# Patient Record
Sex: Male | Born: 1935 | Race: White | Hispanic: No | Marital: Married | Smoking: Former smoker
Health system: Southern US, Community
[De-identification: ages and names within clinical notes are randomized; demographics above are authoritative.]

## PROBLEM LIST (undated history)

## (undated) DIAGNOSIS — J45909 Unspecified asthma, uncomplicated: Secondary | ICD-10-CM

## (undated) DIAGNOSIS — I219 Acute myocardial infarction, unspecified: Secondary | ICD-10-CM

## (undated) DIAGNOSIS — J449 Chronic obstructive pulmonary disease, unspecified: Secondary | ICD-10-CM

## (undated) DIAGNOSIS — G300 Alzheimer's disease with early onset: Secondary | ICD-10-CM

## (undated) DIAGNOSIS — I639 Cerebral infarction, unspecified: Secondary | ICD-10-CM

## (undated) DIAGNOSIS — G4733 Obstructive sleep apnea (adult) (pediatric): Secondary | ICD-10-CM

## (undated) DIAGNOSIS — R0602 Shortness of breath: Secondary | ICD-10-CM

## (undated) DIAGNOSIS — I2510 Atherosclerotic heart disease of native coronary artery without angina pectoris: Secondary | ICD-10-CM

## (undated) DIAGNOSIS — F0280 Dementia in other diseases classified elsewhere without behavioral disturbance: Secondary | ICD-10-CM

## (undated) DIAGNOSIS — N183 Chronic kidney disease, stage 3 unspecified: Secondary | ICD-10-CM

## (undated) DIAGNOSIS — G20 Parkinson's disease: Secondary | ICD-10-CM

## (undated) DIAGNOSIS — I10 Essential (primary) hypertension: Secondary | ICD-10-CM

## (undated) DIAGNOSIS — G20A1 Parkinson's disease without dyskinesia, without mention of fluctuations: Secondary | ICD-10-CM

## (undated) HISTORY — DX: Obstructive sleep apnea (adult) (pediatric): G47.33

## (undated) HISTORY — PX: CORONARY ARTERY BYPASS GRAFT: SHX141

## (undated) HISTORY — PX: CARDIAC CATHETERIZATION: SHX172

## (undated) HISTORY — PX: NASAL SINUS SURGERY: SHX719

## (undated) HISTORY — PX: EYE SURGERY: SHX253

---

## 2004-01-20 ENCOUNTER — Other Ambulatory Visit: Payer: Self-pay

## 2004-01-21 ENCOUNTER — Other Ambulatory Visit: Payer: Self-pay

## 2004-01-22 ENCOUNTER — Other Ambulatory Visit: Payer: Self-pay

## 2004-01-24 ENCOUNTER — Other Ambulatory Visit: Payer: Self-pay

## 2004-01-25 ENCOUNTER — Other Ambulatory Visit: Payer: Self-pay

## 2004-10-02 ENCOUNTER — Ambulatory Visit: Payer: Self-pay | Admitting: Internal Medicine

## 2004-11-04 ENCOUNTER — Ambulatory Visit: Payer: Self-pay

## 2004-11-21 ENCOUNTER — Ambulatory Visit: Payer: Self-pay

## 2005-07-22 ENCOUNTER — Ambulatory Visit: Payer: Self-pay | Admitting: Internal Medicine

## 2005-08-04 ENCOUNTER — Ambulatory Visit: Payer: Self-pay | Admitting: Otolaryngology

## 2005-11-18 ENCOUNTER — Other Ambulatory Visit: Payer: Self-pay

## 2005-11-18 ENCOUNTER — Ambulatory Visit: Payer: Self-pay | Admitting: Otolaryngology

## 2005-12-04 ENCOUNTER — Ambulatory Visit: Payer: Self-pay | Admitting: Otolaryngology

## 2007-06-17 ENCOUNTER — Ambulatory Visit: Payer: Self-pay | Admitting: Internal Medicine

## 2007-07-02 ENCOUNTER — Ambulatory Visit: Payer: Self-pay | Admitting: Surgery

## 2008-06-20 ENCOUNTER — Ambulatory Visit: Payer: Self-pay | Admitting: Internal Medicine

## 2008-07-20 ENCOUNTER — Ambulatory Visit: Payer: Self-pay | Admitting: Internal Medicine

## 2008-08-10 ENCOUNTER — Ambulatory Visit: Payer: Self-pay | Admitting: Internal Medicine

## 2008-08-31 ENCOUNTER — Ambulatory Visit: Payer: Self-pay | Admitting: Internal Medicine

## 2008-10-06 ENCOUNTER — Emergency Department (HOSPITAL_COMMUNITY): Admission: EM | Admit: 2008-10-06 | Discharge: 2008-10-06 | Payer: Self-pay | Admitting: Emergency Medicine

## 2010-02-20 ENCOUNTER — Ambulatory Visit: Payer: Self-pay | Admitting: Neurology

## 2010-02-22 ENCOUNTER — Ambulatory Visit: Payer: Self-pay | Admitting: Neurology

## 2010-08-01 ENCOUNTER — Ambulatory Visit: Payer: Self-pay | Admitting: Internal Medicine

## 2010-08-01 ENCOUNTER — Ambulatory Visit: Payer: Self-pay | Admitting: Vascular Surgery

## 2010-08-22 ENCOUNTER — Ambulatory Visit: Payer: Self-pay | Admitting: Vascular Surgery

## 2010-09-03 ENCOUNTER — Inpatient Hospital Stay: Payer: Self-pay | Admitting: Vascular Surgery

## 2011-03-24 ENCOUNTER — Ambulatory Visit: Payer: Self-pay | Admitting: Internal Medicine

## 2011-03-24 ENCOUNTER — Other Ambulatory Visit: Payer: Self-pay | Admitting: Family Medicine

## 2011-05-16 ENCOUNTER — Encounter: Payer: Self-pay | Admitting: Podiatry

## 2011-05-16 DIAGNOSIS — F329 Major depressive disorder, single episode, unspecified: Secondary | ICD-10-CM

## 2011-05-16 DIAGNOSIS — R413 Other amnesia: Secondary | ICD-10-CM

## 2011-05-16 DIAGNOSIS — F32A Depression, unspecified: Secondary | ICD-10-CM | POA: Insufficient documentation

## 2011-05-16 DIAGNOSIS — R0989 Other specified symptoms and signs involving the circulatory and respiratory systems: Secondary | ICD-10-CM

## 2011-09-23 LAB — COMPREHENSIVE METABOLIC PANEL
ALT: 21
Alkaline Phosphatase: 66
BUN: 19
Calcium: 9.1
Chloride: 103
GFR calc Af Amer: 57 — ABNORMAL LOW
GFR calc non Af Amer: 47 — ABNORMAL LOW
Sodium: 135
Total Bilirubin: 1
Total Protein: 6.5

## 2011-09-23 LAB — URINALYSIS, ROUTINE W REFLEX MICROSCOPIC
Bilirubin Urine: NEGATIVE
Hgb urine dipstick: NEGATIVE
Ketones, ur: NEGATIVE
Specific Gravity, Urine: 1.023
Urobilinogen, UA: 0.2

## 2011-09-23 LAB — CBC
HCT: 38.3 — ABNORMAL LOW
MCHC: 34
MCV: 94.3
Platelets: 132 — ABNORMAL LOW
RBC: 4.06 — ABNORMAL LOW

## 2011-09-23 LAB — DIFFERENTIAL
Basophils Absolute: 0
Eosinophils Absolute: 0
Monocytes Relative: 3
Neutrophils Relative %: 92 — ABNORMAL HIGH

## 2011-09-23 LAB — URINE MICROSCOPIC-ADD ON

## 2012-02-24 ENCOUNTER — Ambulatory Visit: Payer: Self-pay | Admitting: Vascular Surgery

## 2012-02-24 LAB — BASIC METABOLIC PANEL
Anion Gap: 8 (ref 7–16)
BUN: 22 mg/dL — ABNORMAL HIGH (ref 7–18)
Chloride: 102 mmol/L (ref 98–107)
Co2: 28 mmol/L (ref 21–32)
Osmolality: 279 (ref 275–301)
Potassium: 4 mmol/L (ref 3.5–5.1)
Sodium: 138 mmol/L (ref 136–145)

## 2012-06-03 ENCOUNTER — Inpatient Hospital Stay (HOSPITAL_COMMUNITY)
Admission: EM | Admit: 2012-06-03 | Discharge: 2012-06-04 | DRG: 070 | Disposition: A | Discharge status: Home / Self Care | Payer: Medicare Other | Source: Ambulatory Visit | Attending: Family Medicine | Admitting: Family Medicine

## 2012-06-03 ENCOUNTER — Encounter (HOSPITAL_COMMUNITY): Payer: Self-pay | Admitting: *Deleted

## 2012-06-03 ENCOUNTER — Emergency Department (HOSPITAL_COMMUNITY): Payer: Medicare Other

## 2012-06-03 ENCOUNTER — Observation Stay (HOSPITAL_COMMUNITY): Payer: Medicare Other

## 2012-06-03 DIAGNOSIS — J40 Bronchitis, not specified as acute or chronic: Secondary | ICD-10-CM

## 2012-06-03 DIAGNOSIS — N183 Chronic kidney disease, stage 3 unspecified: Secondary | ICD-10-CM | POA: Diagnosis present

## 2012-06-03 DIAGNOSIS — J441 Chronic obstructive pulmonary disease with (acute) exacerbation: Secondary | ICD-10-CM | POA: Diagnosis present

## 2012-06-03 DIAGNOSIS — I2510 Atherosclerotic heart disease of native coronary artery without angina pectoris: Secondary | ICD-10-CM | POA: Diagnosis present

## 2012-06-03 DIAGNOSIS — Z8673 Personal history of transient ischemic attack (TIA), and cerebral infarction without residual deficits: Secondary | ICD-10-CM

## 2012-06-03 DIAGNOSIS — R410 Disorientation, unspecified: Secondary | ICD-10-CM

## 2012-06-03 DIAGNOSIS — Z79899 Other long term (current) drug therapy: Secondary | ICD-10-CM

## 2012-06-03 DIAGNOSIS — R509 Fever, unspecified: Secondary | ICD-10-CM

## 2012-06-03 DIAGNOSIS — J189 Pneumonia, unspecified organism: Secondary | ICD-10-CM | POA: Diagnosis present

## 2012-06-03 DIAGNOSIS — Z7982 Long term (current) use of aspirin: Secondary | ICD-10-CM

## 2012-06-03 DIAGNOSIS — I129 Hypertensive chronic kidney disease with stage 1 through stage 4 chronic kidney disease, or unspecified chronic kidney disease: Secondary | ICD-10-CM | POA: Diagnosis present

## 2012-06-03 DIAGNOSIS — Z9861 Coronary angioplasty status: Secondary | ICD-10-CM

## 2012-06-03 DIAGNOSIS — G9341 Metabolic encephalopathy: Principal | ICD-10-CM | POA: Diagnosis present

## 2012-06-03 DIAGNOSIS — Z87891 Personal history of nicotine dependence: Secondary | ICD-10-CM

## 2012-06-03 DIAGNOSIS — F0390 Unspecified dementia without behavioral disturbance: Secondary | ICD-10-CM | POA: Diagnosis present

## 2012-06-03 HISTORY — DX: Alzheimer's disease with early onset: G30.0

## 2012-06-03 HISTORY — DX: Essential (primary) hypertension: I10

## 2012-06-03 HISTORY — DX: Cerebral infarction, unspecified: I63.9

## 2012-06-03 HISTORY — DX: Dementia in other diseases classified elsewhere, unspecified severity, without behavioral disturbance, psychotic disturbance, mood disturbance, and anxiety: F02.80

## 2012-06-03 HISTORY — DX: Unspecified asthma, uncomplicated: J45.909

## 2012-06-03 HISTORY — DX: Shortness of breath: R06.02

## 2012-06-03 HISTORY — DX: Acute myocardial infarction, unspecified: I21.9

## 2012-06-03 LAB — COMPREHENSIVE METABOLIC PANEL
AST: 25 U/L (ref 0–37)
Albumin: 3.5 g/dL (ref 3.5–5.2)
CO2: 20 mEq/L (ref 19–32)
Calcium: 9.6 mg/dL (ref 8.4–10.5)
Chloride: 102 mEq/L (ref 96–112)
Creatinine, Ser: 1.48 mg/dL — ABNORMAL HIGH (ref 0.50–1.35)
GFR calc Af Amer: 51 mL/min — ABNORMAL LOW (ref >90)
GFR calc non Af Amer: 44 mL/min — ABNORMAL LOW (ref >90)
Glucose, Bld: 175 mg/dL — ABNORMAL HIGH (ref 70–99)
Potassium: 4.7 mEq/L (ref 3.5–5.1)
Sodium: 138 mEq/L (ref 135–145)
Total Protein: 7.3 g/dL (ref 6.0–8.3)

## 2012-06-03 LAB — DIFFERENTIAL
Basophils Absolute: 0 10*3/uL (ref 0.0–0.1)
Basophils Relative: 0 % (ref 0–1)
Eosinophils Absolute: 0.1 10*3/uL (ref 0.0–0.7)
Monocytes Absolute: 1 10*3/uL (ref 0.1–1.0)
Neutro Abs: 10.4 10*3/uL — ABNORMAL HIGH (ref 1.7–7.7)

## 2012-06-03 LAB — URINALYSIS, ROUTINE W REFLEX MICROSCOPIC
Glucose, UA: NEGATIVE mg/dL
Hgb urine dipstick: NEGATIVE
Ketones, ur: NEGATIVE mg/dL
Protein, ur: NEGATIVE mg/dL
Urobilinogen, UA: 0.2 mg/dL (ref 0.0–1.0)

## 2012-06-03 LAB — LACTIC ACID, PLASMA: Lactic Acid, Venous: 1.8 mmol/L (ref 0.5–2.2)

## 2012-06-03 LAB — CBC
HCT: 38.3 % — ABNORMAL LOW (ref 39.0–52.0)
MCH: 32.3 pg (ref 26.0–34.0)
MCHC: 34.6 g/dL (ref 30.0–36.0)
MCV: 93 fL (ref 78.0–100.0)
MCV: 93.4 fL (ref 78.0–100.0)
Platelets: 127 10*3/uL — ABNORMAL LOW (ref 150–400)
RBC: 4.12 MIL/uL — ABNORMAL LOW (ref 4.22–5.81)
RBC: 3.65 MIL/uL — ABNORMAL LOW (ref 4.22–5.81)

## 2012-06-03 LAB — TROPONIN I: Troponin I: <0.3 ng/mL (ref <0.30)

## 2012-06-03 MED ORDER — MOXIFLOXACIN HCL IN NACL 400 MG/250ML IV SOLN
400.0000 mg | Freq: Once | INTRAVENOUS | Status: AC
  Administered 2012-06-03: 400 mg via INTRAVENOUS
  Filled 2012-06-03: qty 250

## 2012-06-03 MED ORDER — DEXTROSE-NACL 5-0.45 % IV SOLN
INTRAVENOUS | Status: AC
  Administered 2012-06-03: 1000 mL via INTRAVENOUS

## 2012-06-03 MED ORDER — SIMVASTATIN 40 MG PO TABS
40.0000 mg | ORAL_TABLET | Freq: Every day | ORAL | Status: DC
  Administered 2012-06-03: 40 mg via ORAL
  Filled 2012-06-03 (×2): qty 1

## 2012-06-03 MED ORDER — METOPROLOL SUCCINATE ER 50 MG PO TB24
75.0000 mg | ORAL_TABLET | Freq: Every day | ORAL | Status: DC
  Administered 2012-06-04: 75 mg via ORAL
  Filled 2012-06-03: qty 1

## 2012-06-03 MED ORDER — MELOXICAM 15 MG PO TABS: 15.0000 mg | ORAL_TABLET | Freq: Every day | ORAL | Status: DC

## 2012-06-03 MED ORDER — AMLODIPINE BESYLATE 5 MG PO TABS
5.0000 mg | ORAL_TABLET | Freq: Every day | ORAL | Status: DC
  Administered 2012-06-04: 5 mg via ORAL
  Filled 2012-06-03: qty 1

## 2012-06-03 MED ORDER — SODIUM CHLORIDE 0.9 % IJ SOLN: 3.0000 mL | Freq: Two times a day (BID) | INTRAMUSCULAR | Status: DC

## 2012-06-03 MED ORDER — ACETAMINOPHEN 325 MG PO TABS
650.0000 mg | ORAL_TABLET | Freq: Once | ORAL | Status: AC
  Administered 2012-06-03: 650 mg via ORAL
  Filled 2012-06-03: qty 2

## 2012-06-03 MED ORDER — SODIUM CHLORIDE 0.9 % IV SOLN
1000.0000 mL | INTRAVENOUS | Status: DC
  Administered 2012-06-03 – 2012-06-04 (×2): 1000 mL via INTRAVENOUS

## 2012-06-03 MED ORDER — SODIUM CHLORIDE 0.9 % IV SOLN
1000.0000 mL | Freq: Once | INTRAVENOUS | Status: AC
  Administered 2012-06-03: 1000 mL via INTRAVENOUS

## 2012-06-03 MED ORDER — CITALOPRAM HYDROBROMIDE 20 MG PO TABS
20.0000 mg | ORAL_TABLET | Freq: Every day | ORAL | Status: DC
  Administered 2012-06-04: 20 mg via ORAL
  Filled 2012-06-03: qty 1

## 2012-06-03 MED ORDER — MOXIFLOXACIN HCL IN NACL 400 MG/250ML IV SOLN
400.0000 mg | INTRAVENOUS | Status: DC
  Filled 2012-06-03: qty 250

## 2012-06-03 MED ORDER — POLYETHYLENE GLYCOL 3350 17 G PO PACK
17.0000 g | PACK | Freq: Every day | ORAL | Status: DC | PRN
  Filled 2012-06-03: qty 1

## 2012-06-03 MED ORDER — HEPARIN SODIUM (PORCINE) 5000 UNIT/ML IJ SOLN
5000.0000 [IU] | Freq: Three times a day (TID) | INTRAMUSCULAR | Status: DC
  Administered 2012-06-03: 5000 [IU] via SUBCUTANEOUS
  Filled 2012-06-03 (×5): qty 1

## 2012-06-03 MED ORDER — ACETAMINOPHEN 325 MG PO TABS: 650.0000 mg | ORAL_TABLET | Freq: Four times a day (QID) | ORAL | Status: DC | PRN

## 2012-06-03 MED ORDER — ENALAPRIL MALEATE 20 MG PO TABS
20.0000 mg | ORAL_TABLET | Freq: Two times a day (BID) | ORAL | Status: DC
  Filled 2012-06-03: qty 1

## 2012-06-03 MED ORDER — ASPIRIN EC 81 MG PO TBEC
81.0000 mg | DELAYED_RELEASE_TABLET | Freq: Every day | ORAL | Status: DC
  Administered 2012-06-04: 81 mg via ORAL
  Filled 2012-06-03: qty 1

## 2012-06-03 MED ORDER — FLUTICASONE-SALMETEROL 250-50 MCG/DOSE IN AEPB
1.0000 | INHALATION_SPRAY | Freq: Two times a day (BID) | RESPIRATORY_TRACT | Status: DC
  Filled 2012-06-03: qty 14

## 2012-06-03 MED ORDER — ENALAPRIL MALEATE 20 MG PO TABS
20.0000 mg | ORAL_TABLET | Freq: Two times a day (BID) | ORAL | Status: DC
  Administered 2012-06-03 – 2012-06-04 (×2): 20 mg via ORAL
  Filled 2012-06-03 (×3): qty 1

## 2012-06-03 MED ORDER — SODIUM CHLORIDE 0.9 % IV SOLN
INTRAVENOUS | Status: DC
  Administered 2012-06-03: 1000 mL via INTRAVENOUS

## 2012-06-03 MED ORDER — ACETAMINOPHEN 650 MG RE SUPP: 650.0000 mg | Freq: Four times a day (QID) | RECTAL | Status: DC | PRN

## 2012-06-03 NOTE — H&P (Signed)
Family Medicine Teaching [facility] Admission History and Physical  Patient name: [name] Medical record number: [id number] Date of birth: January 25, 1933 Age: 78 y.o. Gender: male  Primary Care Provider: Daniel Nones  Chief Complaint: AMS  History of Present Illness: [name] is a 78 y.o. year old male presenting with acute onset of AMS   Pt awoke this afternoon from his nap and came out to living room to sit in recliner. Pt started talking nonsensically and was disoriented and kept playing w/ his shorts. After further thought, wife did disclose that she felt like he was slurring his words, acting confused, and was off balance and possible weak.  Pt is demented at baseline, but per wife this was significantly worse. 911 was called and when EMS arrived pts BP was noted to be 220/110 per wife. Pt mental status improved significantly and was near baseline at time of exam. Pt has COPD and uses CPAP at night. Endorses some SOB over the past few months. Denies fever, CP, lightheadedness, syncope, palpitations, n/v/d/c. Has had a recent cough that has lasted for the past couple of months.  At the time of interview once on the floor, patient was aware of situation, person, and place, though couldn't remember what had happened earlier in the day (this is his baseline per his wife-- he would not be able to remember multiple hours ago on his best day).  He has no complaints of CP, SOB, confusion, weakness, headache.   Review Of Systems: Per HPI   Patient Active Problem List  Diagnosis  . Depression  . Memory loss  . Poor circulation   Past Medical History: Questionable Stroke (never hospitalized) --> per wife, 10 "mini strokes" found on imaging but they were not aware that these had occurred  DM Dementia HTN CAD (s/p stent placement) COPD  Past Surgical History: Endarterectomy on Rt Cardiac stent  Social History: History   Social History  . Marital Status: Married    Spouse  Name: N/A    Number of Children: N/A  . Years of Education: N/A   Social History Main Topics  . Smoking status: Former Smoker    Quit date: 12/18/2005  . Smokeless tobacco: Not on file  . Alcohol Use: No  . Drug Use: No  . Sexually Active:    Other Topics Concern  . Not on file   Social History Narrative  . No narrative on file    Family History: Family History  Problem Relation Age of Onset  . Cancer Father     Allergies: No Known Allergies  Current Facility-Administered Medications  Medication Dose Route Frequency Provider Last Rate Last Dose  . 0.9 %  sodium chloride infusion  1,000 mL Intravenous Once Celene Kras, MD 999 mL/hr at 06/03/12 1701 1,000 mL at 06/03/12 1701   Followed by  . 0.9 %  sodium chloride infusion  1,000 mL Intravenous Continuous Celene Kras, MD 125 mL/hr at 06/03/12 1806 1,000 mL at 06/03/12 1806  . acetaminophen (TYLENOL) tablet 650 mg  650 mg Oral Once Celene Kras, MD   650 mg at 06/03/12 1704  . moxifloxacin (AVELOX) IVPB 400 mg  400 mg Intravenous Once Celene Kras, MD   400 mg at 06/03/12 2003     Physical Exam: BP 123/52  Pulse 78  Temp 101.1 F (38.4 C) (Rectal)  Resp 21  SpO2 96%  General: NAD, comfortable. Oriented to person time and place HEENT: PERRLA, extra ocular  movement intact, sclera clear, anicteric, oropharynx clear, no lesions, neck supple with midline trachea, thyroid without masses, trachea midline and No nuchal rigidity Heart: Irregularly irreglar, no m/r/g. Distant heart sounds Lungs: Possible ronchi in L middle to lowe lung field, poor respiratory effort; scattered wheezes throughout all lung fields, possible rhonchi LUL cleared with cough Abdomen:NABS, non-painful to palpation Extremities: 2+ pulses throughout Musculoskeletal: normal ROM Skin:No rashes, non-icteric Neurology: CN grossly intact, AAOx3, moves limbs symmetrically, normal finger to nose and rapid alternating movements, no obvious weakness or strength  differences bilaterally   Labs and Imaging: CBC:    Component Value Date/Time   WBC 12.5* 06/03/2012 1606   HGB 13.4 06/03/2012 1606   HCT 38.3* 06/03/2012 1606   PLT 140* 06/03/2012 1606   MCV 93.0 06/03/2012 1606   NEUTROABS 10.4* 06/03/2012 1606   LYMPHSABS 1.0 06/03/2012 1606   MONOABS 1.0 06/03/2012 1606   EOSABS 0.1 06/03/2012 1606   BASOSABS 0.0 06/03/2012 1606     Comprehensive Metabolic Panel:    Component Value Date/Time   NA 138 06/03/2012 1606   K 4.7 06/03/2012 1606   CL 102 06/03/2012 1606   CO2 20 06/03/2012 1606   BUN 24* 06/03/2012 1606   CREATININE 1.48* 06/03/2012 1606   GLUCOSE 175* 06/03/2012 1606   CALCIUM 9.6 06/03/2012 1606   AST 25 06/03/2012 1606   ALT 15 06/03/2012 1606   ALKPHOS 65 06/03/2012 1606   BILITOT 0.7 06/03/2012 1606   PROT 7.3 06/03/2012 1606   ALBUMIN 3.5 06/03/2012 1606    Urinalysis    Component Value Date/Time   COLORURINE YELLOW 06/03/2012 1742   APPEARANCEUR CLEAR 06/03/2012 1742   LABSPEC 1.018 06/03/2012 1742   PHURINE 5.5 06/03/2012 1742   GLUCOSEU NEGATIVE 06/03/2012 1742   HGBUR NEGATIVE 06/03/2012 1742   BILIRUBINUR NEGATIVE 06/03/2012 1742   KETONESUR NEGATIVE 06/03/2012 1742   PROTEINUR NEGATIVE 06/03/2012 1742   UROBILINOGEN 0.2 06/03/2012 1742   NITRITE NEGATIVE 06/03/2012 1742   LEUKOCYTESUR NEGATIVE 06/03/2012 1742   Troponin: <0.3 Lactic Acid: 1.8 Procalcitonin <0.1  Urine and Blodd Cx x2 pending   Ct Head Wo Contrast IMPRESSION: No acute finding.  Atrophy and chronic microvascular ischemic change.  Original Report Authenticated By: Bernadene Bell. D'ALESSIO, M.D.   Dg Chest Portable 1 View IMPRESSION: COPD.  Mild cardiomegaly.  No active lung disease.  Original Report Authenticated By: Juline Patch, M.D.   EKG: Normal sinus rhythm. Possible LBBB. No previous EKG.   Assessment and Plan: [name] is a 78 y.o. year old male w/ h/o stroke, dementia, DM, HTN, CAD s/p stent, and COPD is presenting with acute AMS, 67mo h/o cough and  fever  AMS/Encephalopathy: Likely secondary to illness (pneumo vs meningitis vs UTI) compounding pt already demented state, vs CVA/resolved TIA as pt w/ hx and in afib on PE, vs hypoxemia as pt awoke from nap w/o using home  CPAP, vs  Hypo/hyperglycemia. Workup as above. Likely in early stage of pneumonia. WBC: 12.5 on admission.  - Admit to Tele - Neuro checks - f/u cultures (blood, urine sent in ED prior to antibiotics) - Avelox -Repeat EKG for ?new onset a fib - +/- Echo if EKG or tele showing A.fib - repeat CXR in am (possible PNA) - CBC in am  Resp: COPD. Some increased WOB on exam and questionable L sided ronchi. - Continue Advair - O2 as needed - CPAP  CKD: Cr. 1.48 on admission. Previous Cr of 1.4. UOP adequate pre  family.  - Hold lisinopril - hold meloxicam - BMP  CV: Possible A.fib. H/o CAD adn stent. Troponin negative - Continue statin - Tele - Repeat EKG now and in am - ASA  HTN: BP appropriate in ED.  - Metoprolol - Amlodipine - lisinopril (Cr at baseline)  FEN/GI: Taking less PO today - IVF NS 125ml/hr - heart healthy diet - BMET in am  Prophylaxis: Heparin SQ TID  Disposition: Pending further improvement.   Signed: Shelly Flatten, M.D. Family Medicine Resident PGY-1 [phone] 06/03/2012 8:37 PM   UPPER LEVEL ADDENDUM  I have seen and examined [name] with Dr. Konrad Dolores and I agree with the above assessment/plan. I have reviewed all available data and have made any necessary changes to the above H&P.  [name] 06/03/2012, 9:51 PM

## 2012-06-03 NOTE — ED Notes (Signed)
Per EMS,  Pt last seen normal approx. 1100am today.  Pt awakened from a nap with decreased LOC as evidenced by not knowing who wife was or neighbors.  According to EMS, pt without facial drooping, could walk and speak without difficulty... Pt was noted to be weak.

## 2012-06-03 NOTE — ED Provider Notes (Signed)
History     CSN: [id number]  Arrival date & time 06/03/12  1558   First MD Initiated Contact with Patient 06/03/12 1613     Chief complaint: Altered mental status Level 5 caveat : altered mental status HPI The patient presents to the emergency room initially as a code stroke. He was brought in by EMS. Dr. Roseanne Reno, neurology, evaluated him and promptly canceled the code stroke. Patient was found to have a fever of 103. He is having some trouble with confusion but states he's been having a cough recently. Denies feeling short of breath. He denies any chest pain or abdominal pain.  He denies vomiting or diarrhea but he wasn't sure. History is limited by the patient's confusion No past medical history on file.  No past surgical history on file.  Family History  Problem Relation Age of Onset  . Cancer Father     History  Substance Use Topics  . Smoking status: Former Smoker    Quit date: 12/18/2005  . Smokeless tobacco: Not on file  . Alcohol Use: No      Review of Systems  Unable to perform ROS: Mental status change  Constitutional: Positive for fever.  Respiratory: Positive for cough.   Neurological: Positive for tremors. Negative for speech difficulty.    Allergies  Review of patient's allergies indicates no known allergies.  Home Medications   Current Outpatient Rx  Name Route Sig Dispense Refill  . CITALOPRAM HYDROBROMIDE 20 MG PO TABS Oral Take 20 mg by mouth.      Marland Kitchen FLUTICASONE-SALMETEROL 250-50 MCG/DOSE IN AEPB Inhalation Inhale 1 puff into the lungs.      Marland Kitchen GLIPIZIDE 5 MG PO TABS Oral Take 5 mg by mouth.      Marland Kitchen LOVASTATIN 40 MG PO TABS Oral Take 40 mg by mouth.      . METOPROLOL SUCCINATE ER 100 MG PO TB24 Oral Take 100 mg by mouth daily. 1/2 tablet     . ONE-DAILY MULTI VITAMINS PO TABS Oral Take 1 tablet by mouth.      Marland Kitchen FISH OIL TRIPLE STRENGTH PO Oral Take by mouth.        BP 146/71  Pulse 87  Temp 103.1 F (39.5 C) (Rectal)  Resp 24  SpO2  93%  Physical Exam  Nursing note and vitals reviewed. Constitutional: No distress.  HENT:  Head: Normocephalic and atraumatic.  Right Ear: External ear normal.  Left Ear: External ear normal.  Mouth/Throat: No oropharyngeal exudate (icterus membranes dry, dental caries, questionable contusion distal tongue).  Eyes: Conjunctivae are normal. Right eye exhibits no discharge. Left eye exhibits no discharge. No scleral icterus.  Neck: Neck supple. No rigidity. No tracheal deviation, no edema and normal range of motion present. No mass present.  Cardiovascular: Normal rate, regular rhythm and intact distal pulses.   Pulmonary/Chest: Effort normal. No stridor. No respiratory distress. He has no wheezes. He has rhonchi. He has no rales.  Abdominal: Soft. Bowel sounds are normal. He exhibits no distension. There is no tenderness. There is no rebound and no guarding.  Musculoskeletal: He exhibits no edema and no tenderness.  Neurological: He is alert. He has normal strength. He is disoriented. No cranial nerve deficit ( no gross defecits noted ) or sensory deficit. He exhibits normal muscle tone. He displays no seizure activity. GCS eye subscore is 4. GCS verbal subscore is 4. GCS motor subscore is 6.       Tremulous, general weakness, moves all 4 extremities  Skin: Skin is warm and dry. No rash noted.  Psychiatric: He has a normal mood and affect.    ED Course  Procedures (including critical care time)  Rate: 83  Rhythm: normal sinus rhythm  QRS Axis: borderline left axis deviation  Intervals: normal  ST/T Wave abnormalities: normal  Conduction Disutrbances:LBBB  Narrative Interpretation: abnormal  Old EKG Reviewed: no prior ekg  Labs Reviewed  CBC - Abnormal; Notable for the following:    WBC 12.5 (*)     RBC 4.12 (*)     HCT 38.3 (*)     Platelets 140 (*)     All other components within normal limits  DIFFERENTIAL - Abnormal; Notable for the following:    Neutrophils Relative 83 (*)      Neutro Abs 10.4 (*)     Lymphocytes Relative 8 (*)     All other components within normal limits  PROTIME-INR  APTT  COMPREHENSIVE METABOLIC PANEL  TROPONIN I  CULTURE, BLOOD (ROUTINE X 2)  CULTURE, BLOOD (ROUTINE X 2)  LACTIC ACID, PLASMA  PROCALCITONIN  URINALYSIS, ROUTINE W REFLEX MICROSCOPIC  URINE CULTURE   Ct Head Wo Contrast  06/03/2012  *RADIOLOGY REPORT*  Clinical Data: Confusion.  CT HEAD WITHOUT CONTRAST  Technique:  Contiguous axial images were obtained from the base of the skull through the vertex without contrast.  Comparison: Head CT scan 10/06/2008.  Findings: Again seen is cortical atrophy and extensive chronic microvascular ischemic change.  No evidence of acute abnormality including infarction, hemorrhage, mass lesion, mass effect, midline shift or abnormal extra-axial fluid collection.  Minimal mucosal thickening left maxillary sinus noted.  No pneumocephalus or hydrocephalus.  The patient is status post maxillary antrostomy on the left.  Calvarium intact.  IMPRESSION: No acute finding.  Atrophy and chronic microvascular ischemic change.  Original Report Authenticated By: Bernadene Bell. D'ALESSIO, M.D.   Dg Chest Portable 1 View  06/03/2012  *RADIOLOGY REPORT*  Clinical Data: There are  PORTABLE CHEST - 1 VIEW  Comparison: Chest x-ray of 10/06/2008  Findings: The lungs are clear and hyperaerated consistent with COPD. Biapical pleuroparenchymal scarring is stable.  Mediastinal contours are stable.  The heart is mildly enlarged.  No bony abnormality is seen.  IMPRESSION: COPD.  Mild cardiomegaly.  No active lung disease.  Original Report Authenticated By: Juline Patch, M.D.     1. Delirium   2. Fever   3. Bronchitis       MDM  The patient has improved during his stay in the emergency department. He is more alert and oriented at this time. He appears to be speaking more clearly and is no longer confused. His family is present states he was very confused earlier and was  not making any sense.  This week he has had a bad cough and sore throat.  Suspect his delirium earlier was related to his fever. The source of infection is most likely bronchitis. It is possible he has pneumonia and is just not evident on x-ray at this time.        Celene Kras, MD 06/03/12 [phone]

## 2012-06-04 ENCOUNTER — Encounter (HOSPITAL_COMMUNITY): Payer: Self-pay | Admitting: *Deleted

## 2012-06-04 ENCOUNTER — Observation Stay (HOSPITAL_COMMUNITY): Payer: Medicare Other

## 2012-06-04 DIAGNOSIS — J159 Unspecified bacterial pneumonia: Secondary | ICD-10-CM

## 2012-06-04 DIAGNOSIS — F0390 Unspecified dementia without behavioral disturbance: Secondary | ICD-10-CM

## 2012-06-04 DIAGNOSIS — F05 Delirium due to known physiological condition: Secondary | ICD-10-CM

## 2012-06-04 LAB — CBC
Hemoglobin: 12 g/dL — ABNORMAL LOW (ref 13.0–17.0)
MCH: 32 pg (ref 26.0–34.0)
MCHC: 34.2 g/dL (ref 30.0–36.0)
MCV: 93.6 fL (ref 78.0–100.0)
Platelets: 127 10*3/uL — ABNORMAL LOW (ref 150–400)
RBC: 3.75 MIL/uL — ABNORMAL LOW (ref 4.22–5.81)

## 2012-06-04 LAB — BASIC METABOLIC PANEL
BUN: 21 mg/dL (ref 6–23)
CO2: 22 mEq/L (ref 19–32)
Calcium: 8.9 mg/dL (ref 8.4–10.5)
Glucose, Bld: 128 mg/dL — ABNORMAL HIGH (ref 70–99)
Sodium: 137 mEq/L (ref 135–145)

## 2012-06-04 LAB — GLUCOSE, CAPILLARY: Glucose-Capillary: 116 mg/dL — ABNORMAL HIGH (ref 70–99)

## 2012-06-04 LAB — URINE CULTURE
Colony Count: NO GROWTH
Culture: NO GROWTH

## 2012-06-04 MED ORDER — LEVOFLOXACIN 750 MG PO TABS: 750.0000 mg | ORAL_TABLET | Freq: Every day | ORAL | Status: AC

## 2012-06-04 NOTE — Discharge Summary (Signed)
Discharge Summary 06/04/2012 12:00 PM  [name] DOB: April 13, 1933 MRN: [id number]  Date of Admission: 06/03/2012 Date of Discharge: 06/04/2012  PCP: [name] Consultants: none  Reason for Admission: altered mental status  Discharge Diagnosis Primary 1. Altered mental status - likely acute metabolic encephalopathy, resolved 2. Pneumonia vs COPD exacerbation Secondary 1. CKD 2. HTN 3. CAD  Hospital Course: [name] is a 78 y.o. year old male w/ h/o stroke, dementia, DM, HTN, CAD s/p stent, and COPD presenting with acute AMS and fever with 2 month history of cough.  1. Altered mental status: Patient became acutely altered on the day of admission with symptoms consistent with delirium.  This was likely secondary to an infection given concomitant fever and complete resolution with antibiotics. Did consider TIA/stroke, however this was felt to be very unlikely given history and physical.  His mental status improved back to his baseline, both by his report and his wife's report.  2. Pneumonia vs COPD exacerbation: Patient was febrile in the ED to 103.  Urinalysis was completely normal.  Urine and blood cultures were sent.  A procalcitonin and lactic acid were within normal limits.  A CXR in the ED and repeat in the morning were negative for acute pneumonia but did show chronic bronchitic changes.  He was started on Avelox and his fevers resolved.  Will continue to treat for bronchitis vs early pnemonia with a 7-day course of Levaquin as outpatient.    3. CKD, stage III: Creatinine stable this admission at his baseline of around 1.4.  4. HTN: Blood pressure was initially quite elevated per EMS in the 220/110.  His pressures during this admission were well controlled in the 120s to 150s systolic.  His home medications were continued.    5. CAD: Stable this admission.  Continued on his home medicines.  Did have some irregular rhythms on palpation of pulse.  However, he was in normal  sinus rhythm on 2 EKGs and no events on telemetry.  Discharge Exam: S: Doing well this morning with no complaints.  No difficulty breathing. Does have mild cough.  Back to baseline mental status.  O: Temp:  [97.9 F (36.6 C)-103.1 F (39.5 C)] 98 F (36.7 C) (06/14 1012) Pulse Rate:  [62-87] 62  (06/14 1012) Resp:  [18-24] 20  (06/14 1012) BP: (123-153)/(52-75) 138/69 mmHg (06/14 1012) SpO2:  [91 %-96 %] 91 % (06/14 1012) FiO2 (%):  [28 %] 28 % (06/13 2059) Weight:  [191 lb 12.8 oz (87 kg)] 191 lb 12.8 oz (87 kg) (06/13 2104) Gen: alert, cooperative, eating breakfast HEENT: AT/[place], MMM CV: regularly irregular, no murmurs Pulm: good air movement, mild rales in left lung base Ext: 2+ pulses, no edema Neuro: alert, answers questions appropriately, no focal deficits  A/P -discharge home follow up with PCP  Procedures: none  Discharge Medications Medication List  As of 06/04/2012 12:00 PM   START taking these medications         levofloxacin 750 MG tablet   Commonly known as: LEVAQUIN   Take 1 tablet (750 mg total) by mouth daily.         CONTINUE taking these medications         ADVAIR DISKUS 250-50 MCG/DOSE Aepb   Generic drug: Fluticasone-Salmeterol      amLODipine 5 MG tablet   Commonly known as: NORVASC      aspirin EC 81 MG tablet      citalopram 20 MG tablet   Commonly  known as: CELEXA      enalapril 20 MG tablet   Commonly known as: VASOTEC      glipiZIDE 5 MG 24 hr tablet   Commonly known as: GLUCOTROL XL      lovastatin 40 MG tablet   Commonly known as: MEVACOR      meloxicam 15 MG tablet   Commonly known as: MOBIC      metoprolol succinate 50 MG 24 hr tablet   Commonly known as: TOPROL-XL      omega-3 acid ethyl esters 1 G capsule   Commonly known as: LOVAZA      PRESERVISION AREDS PO          Where to get your medications    These are the prescriptions that you need to pick up. We sent them to a specific pharmacy, so you will need to go  there to get them.   [id number] [place], [place] - [address]    [address] [place] [address]    Phone: [phone]        levofloxacin 750 MG tablet            Pertinent Hospital Labs  Lab 06/04/12 [phone] 06/03/12 2154 06/03/12 1606  WBC 14.4* 14.5* 12.5*  HGB 12.0* 11.8* 13.4  HCT 35.1* 34.1* 38.3*  PLT 127* 127* 140*    Lab 06/04/12 0627 06/03/12 2154 06/03/12 1606  NA 137 -- 138  K 3.9 -- 4.7  CL 103 -- 102  CO2 22 -- 20  BUN 21 -- 24*  CREATININE 1.34 1.36* 1.48*  CALCIUM 8.9 -- 9.6  PROT -- -- 7.3  BILITOT -- -- 0.7  ALKPHOS -- -- 65  ALT -- -- 15  AST -- -- 25  GLUCOSE 128* -- 175*   Procalcitonin <0.10 Lactic acid: 1.8  Pro-BNP: 518.3 TSH: 1.18  UA: normal  CT head No acute finding. Atrophy and chronic microvascular ischemic  change.  CXR Chronic lung changes without acute findings.  EKG: normal sinus rhythm with sinus arrhythmia; LBBB  Discharge instructions: see AVS  Condition at discharge: stable  Disposition: home  Pending Tests: urine culture, blood cultures  Follow up: Follow-up Information    Schedule an appointment as soon as possible for a visit with [name], BERT.   Contact information:   [address]   [place] [address] [phone]          Follow up Issues:  1. Consider starting aricept for early dementia  Garrett, Raymond Willingham 06/04/2012, 12:00 PM

## 2012-06-04 NOTE — Discharge Summary (Signed)
Seen and examined.  Agree with DC today as outlined by Dr. [name]. 

## 2012-06-04 NOTE — H&P (Signed)
Seen and examined.  Discussed with Dr. Fara Boros and Konrad Dolores.  Agree with their management.  Briefly 78 yo male with underlying dementia, a brief episode of delirium and documented fever.  He seems to be fully back to baseline today.  Diff Dx is quite broad but the single dx that would cover all his symptoms is an early lower respiratory track infection precipitating delirium in this patient with known decrease functional brain reserve.  Given that he is back to normal, I agree with DC today, no further WU on respiratory quinolone.  I feel that is both the safest and most prudent approach to this patient.

## 2012-06-04 NOTE — Progress Notes (Signed)
Patient is d/c this afternoon with assessments remaining unchanged. D/c papers given and dully signed. Transported down by volunteers accompanied by wife.

## 2012-06-04 NOTE — Care Management Note (Signed)
    Page 1 of 1   06/04/2012     12:14:00 PM   CARE MANAGEMENT NOTE 06/04/2012  Patient:  YAHSIR, WICKENS   Account Number:  [id number]  Date Initiated:  06/04/2012  Documentation initiated by:  Onnie Boer  Subjective/Objective Assessment:   PT WAS ADMITTED WITH AMS AND FEVER     Action/Plan:   PROGRESSION OF CARE AND DISCHARGE PLANNING   Anticipated DC Date:  06/04/2012   Anticipated DC Plan:  HOME/SELF CARE      DC Planning Services  CM consult      Choice offered to / List presented to:             Status of service:  Completed, signed off Medicare Important Message given?   (If response is "NO", the following Medicare IM given date fields will be blank) Date Medicare IM given:   Date Additional Medicare IM given:    Discharge Disposition:  HOME/SELF CARE  Per UR Regulation:  Reviewed for med. necessity/level of care/duration of stay  If discussed at Long Length of Stay Meetings, dates discussed:    Comments:  06/04/12 Onnie Boer, RN, BSN 1212 PT WAS ADMITTED FROM HOME WITH SPOUSE WITH AMS AND FEVER. PT HAS RETURNED TO BASELINE AND HAS ORDERS TO DC TO HOME.

## 2012-06-04 NOTE — Discharge Instructions (Addendum)
You were admitted to the hospital with confusion and fever.  All of this is likely due to an early bronchitis or pneumonia.    Please take the antibiotic Levaquin, 1 tablet daily until they are all gone.  If you develop additional fevers, worsening breathing, or start getting confused again, please call your doctor or go to the ED.  Follow up with PCP next week.

## 2012-06-10 LAB — CULTURE, BLOOD (ROUTINE X 2)
Culture  Setup Time: 201306140244
Culture: NO GROWTH

## 2012-11-16 ENCOUNTER — Ambulatory Visit: Payer: Self-pay | Admitting: Ophthalmology

## 2012-11-30 ENCOUNTER — Other Ambulatory Visit: Payer: Self-pay | Admitting: Physician Assistant

## 2012-11-30 ENCOUNTER — Ambulatory Visit: Payer: Self-pay | Admitting: Internal Medicine

## 2012-11-30 LAB — TROPONIN I: Troponin-I: <0.02 ng/mL

## 2012-12-03 ENCOUNTER — Observation Stay: Payer: Self-pay | Admitting: Internal Medicine

## 2012-12-03 LAB — COMPREHENSIVE METABOLIC PANEL
Alkaline Phosphatase: 104 U/L (ref 50–136)
Calcium, Total: 9.1 mg/dL (ref 8.5–10.1)
EGFR (Non-African Amer.): 44 — ABNORMAL LOW
Glucose: 100 mg/dL — ABNORMAL HIGH (ref 65–99)
Osmolality: 278 (ref 275–301)
SGOT(AST): 28 U/L (ref 15–37)

## 2012-12-03 LAB — CBC WITH DIFFERENTIAL/PLATELET
Basophil %: 0.7 %
Eosinophil #: 0.3 10*3/uL (ref 0.0–0.7)
HCT: 40.7 % (ref 40.0–52.0)
MCH: 32.7 pg (ref 26.0–34.0)
MCHC: 34.8 g/dL (ref 32.0–36.0)
MCV: 94 fL (ref 80–100)
Neutrophil #: 5.3 10*3/uL (ref 1.4–6.5)
RDW: 13.5 % (ref 11.5–14.5)

## 2012-12-03 LAB — TSH: Thyroid Stimulating Horm: 2.63 u[IU]/mL

## 2012-12-03 LAB — URINALYSIS, COMPLETE
Bilirubin,UR: NEGATIVE
Ketone: NEGATIVE
Nitrite: NEGATIVE
Ph: 7 (ref 4.5–8.0)
Protein: NEGATIVE

## 2012-12-03 LAB — CK TOTAL AND CKMB (NOT AT ARMC)
CK, Total: 49 U/L (ref 35–232)
CK-MB: 0.7 ng/mL (ref 0.5–3.6)

## 2012-12-04 LAB — CBC WITH DIFFERENTIAL/PLATELET
Basophil #: 0.1 10*3/uL (ref 0.0–0.1)
Eosinophil #: 0.3 10*3/uL (ref 0.0–0.7)
HCT: 37.6 % — ABNORMAL LOW (ref 40.0–52.0)
HGB: 13.2 g/dL (ref 13.0–18.0)
Lymphocyte %: 36.9 %
MCHC: 35.2 g/dL (ref 32.0–36.0)
Neutrophil %: 49.1 %
Platelet: 147 10*3/uL — ABNORMAL LOW (ref 150–440)
RBC: 4.02 10*6/uL — ABNORMAL LOW (ref 4.40–5.90)
RDW: 13.2 % (ref 11.5–14.5)
WBC: 6.3 10*3/uL (ref 3.8–10.6)

## 2012-12-04 LAB — CK TOTAL AND CKMB (NOT AT ARMC)
CK, Total: 70 U/L (ref 35–232)
CK-MB: 1 ng/mL (ref 0.5–3.6)

## 2012-12-04 LAB — BASIC METABOLIC PANEL
Anion Gap: 9 (ref 7–16)
BUN: 19 mg/dL — ABNORMAL HIGH (ref 7–18)
EGFR (Non-African Amer.): 52 — ABNORMAL LOW
Glucose: 83 mg/dL (ref 65–99)
Osmolality: 277 (ref 275–301)
Sodium: 138 mmol/L (ref 136–145)

## 2012-12-05 LAB — URINE CULTURE

## 2012-12-06 LAB — CBC WITH DIFFERENTIAL/PLATELET
Basophil %: 0.5 %
Eosinophil %: 3.3 %
HCT: 37.6 % — ABNORMAL LOW (ref 40.0–52.0)
HGB: 13.5 g/dL (ref 13.0–18.0)
Lymphocyte %: 27.3 %
MCHC: 35.9 g/dL (ref 32.0–36.0)
Neutrophil #: 4 10*3/uL (ref 1.4–6.5)
Neutrophil %: 59.3 %
RBC: 4.07 10*6/uL — ABNORMAL LOW (ref 4.40–5.90)

## 2012-12-06 LAB — BASIC METABOLIC PANEL
Anion Gap: 7 (ref 7–16)
Calcium, Total: 8.8 mg/dL (ref 8.5–10.1)
Co2: 27 mmol/L (ref 21–32)
Creatinine: 1.49 mg/dL — ABNORMAL HIGH (ref 0.60–1.30)
EGFR (African American): 52 — ABNORMAL LOW
Osmolality: 271 (ref 275–301)

## 2012-12-09 LAB — CULTURE, BLOOD (SINGLE)

## 2013-02-09 ENCOUNTER — Ambulatory Visit: Payer: Self-pay | Admitting: Otolaryngology

## 2013-05-30 ENCOUNTER — Ambulatory Visit: Payer: Self-pay | Admitting: Otolaryngology

## 2013-06-06 ENCOUNTER — Ambulatory Visit (INDEPENDENT_AMBULATORY_CARE_PROVIDER_SITE_OTHER): Payer: Medicare Other | Admitting: Internal Medicine

## 2013-06-06 ENCOUNTER — Encounter: Payer: Self-pay | Admitting: Internal Medicine

## 2013-06-06 VITALS — BP 126/66 | HR 55 | Temp 97.7°F | Ht 73.0 in | Wt 183.0 lb

## 2013-06-06 DIAGNOSIS — I10 Essential (primary) hypertension: Secondary | ICD-10-CM

## 2013-06-06 DIAGNOSIS — J449 Chronic obstructive pulmonary disease, unspecified: Secondary | ICD-10-CM | POA: Insufficient documentation

## 2013-06-06 MED ORDER — OLMESARTAN MEDOXOMIL 40 MG PO TABS: ORAL_TABLET | ORAL | Status: DC

## 2013-06-06 NOTE — Assessment & Plan Note (Addendum)
  When respiratory symptoms begin or become refractory well after a patient reports complete smoking cessation,  Especially when this wasn't the case while they were smoking, a red flag is raised based on the work of Dr [name] which states:  if you quit smoking when your best day FEV1 is still well preserved it is highly unlikely you will progress to severe disease.  That is to say, once the smoking stops,  the symptoms should not suddenly erupt or markedly worsen.  If so, the differential diagnosis should include  obesity/deconditioning,  LPR/Reflux/Aspiration syndromes,  occult CHF, or  especially side effect of medications commonly used in this population, esp ace inhibitors and advair, which I have found tend to synergistically destabilize the upper airway  Will try to treat gerd empirically and see back after off acei for 4 weeks then regroup in terms of what is needed here to control severity of symptoms s the confusing upper airway component from gerd/ acei

## 2013-06-06 NOTE — Progress Notes (Signed)
  Subjective:    Patient ID: [name], male    DOB: 11-14-1933,MRN: [id number]  HPI  42 yowm quit smoking  2006 during admit [facility] for nose bleeding with baseline doe dx'd as copd rx with advair referred by Dr Andee Poles for severe cough and sob at rest.  06/06/2013 pulmonary eval  On ACEi cc chronic mild doe x fast walk or inclines x 8 years but 2 years ago indolent new onset cough daily progressive worse now 24/7 and worse   when lie down to point where now sleeps in recliner some better p flonase > dry hacking or min prod clear x sev tsp.  Not much better even  on prednisone and saba. Some assoc HB and sinus congestion.   No obvious daytime variabilty or assoc purulent sputum or cp or chest tightness, subjective wheeze. No unusual exp hx or h/o childhood pna/ asthma or knowledge of premature birth.    Also denies any obvious fluctuation of symptoms with weather or environmental changes or other aggravating or alleviating factors except as outlined above   Review of Systems  Constitutional: Negative for fever, chills, activity change, appetite change and unexpected weight change.  HENT: Positive for congestion. Negative for sore throat, rhinorrhea, sneezing, trouble swallowing, dental problem, voice change and postnasal drip.   Eyes: Negative for visual disturbance.  Respiratory: Positive for cough and shortness of breath. Negative for choking.   Cardiovascular: Negative for chest pain and leg swelling.  Gastrointestinal: Negative for nausea, vomiting and abdominal pain.  Genitourinary: Negative for difficulty urinating.  Musculoskeletal: Negative for arthralgias.  Skin: Negative for rash.  Psychiatric/Behavioral: Negative for behavioral problems and confusion.       Objective:   Physical Exam  Stoic wm nad Wt Readings from Last 3 Encounters:  06/06/13 183 lb (83.008 kg)  06/03/12 191 lb 12.8 oz (87 kg)    HEENT mild turbinate edema.  Oropharynx no thrush or excess pnd or  cobblestoning.  No JVD or cervical adenopathy. Mild accessory muscle hypertrophy. Trachea midline, nl thryroid. Chest was hyperinflated by percussion with diminished breath sounds and moderate increased exp time without wheeze. Hoover sign positive at mid inspiration. Regular rate and rhythm without murmur gallop or rub or increase P2 or edema.  Abd: no hsm, nl excursion. Ext warm without cyanosis or clubbing.    cxr one week prior to OV   No acute change       Assessment & Plan:

## 2013-06-06 NOTE — Patient Instructions (Addendum)
Stop vasotec (enapril) and advair  Start benicar 40 one half twice daily and if too strong reduce reduce to one half daily   Finish prednisone  Try prilosec 20mg   Take 30-60 min before first meal of the day and Pepcid 20 mg one bedtime until cough is completely gone for at least a week   Fluticasone one twice daily  Please schedule a follow up office visit in 4 weeks, sooner if needed

## 2013-06-06 NOTE — Assessment & Plan Note (Addendum)
ACE inhibitors are problematic in  pts with airway complaints because  even experienced pulmonologists can't always distinguish ace effects from copd/asthma.  By themselves they don't actually cause a problem, much like oxygen can't by itself start a fire, but they certainly serve as a powerful catalyst or enhancer for any "fire"  or inflammatory process in the upper airway, be it caused by an ET  tube or more commonly reflux (especially in the obese or pts with known GERD or who are on biphoshonates).    In the era of ARB  equivalency (in terms of short term benefits) until we have a better handle on the reversibility of the airway problem, it just makes sense to avoid ACEI  entirely in the short run and then decide later, having established a level of airway control using a reasonable limited regimen, whether to add back ace but even then being very careful to observe the pt for worsening airway control and number of meds used/ needed to control symptoms.    rec trial off vasotec and on benicar up to 40 mg daily

## 2013-07-06 ENCOUNTER — Encounter: Payer: Self-pay | Admitting: Internal Medicine

## 2013-07-06 ENCOUNTER — Ambulatory Visit (INDEPENDENT_AMBULATORY_CARE_PROVIDER_SITE_OTHER): Payer: Medicare Other | Admitting: Internal Medicine

## 2013-07-06 VITALS — BP 104/58 | HR 62 | Temp 97.9°F | Ht 74.0 in | Wt 182.0 lb

## 2013-07-06 DIAGNOSIS — I10 Essential (primary) hypertension: Secondary | ICD-10-CM

## 2013-07-06 DIAGNOSIS — J449 Chronic obstructive pulmonary disease, unspecified: Secondary | ICD-10-CM

## 2013-07-06 DIAGNOSIS — J4489 Other specified chronic obstructive pulmonary disease: Secondary | ICD-10-CM

## 2013-07-06 MED ORDER — OLMESARTAN MEDOXOMIL 40 MG PO TABS: ORAL_TABLET | ORAL | Status: DC

## 2013-07-06 NOTE — Patient Instructions (Addendum)
Change  benicar 40 one half daily    Try prilosec 20mg   Take 30-60 min before first meal of the day and Pepcid 20 mg one bedtime until cough is completely gone for at least a week   GERD (REFLUX)  is an extremely common cause of respiratory symptoms, many times with no significant heartburn at all.    It can be treated with medication, but also with lifestyle changes including avoidance of late meals, excessive alcohol, smoking cessation, and avoid fatty foods, chocolate, peppermint, colas, red wine, and acidic juices such as orange juice.  NO MINT OR MENTHOL PRODUCTS SO NO COUGH DROPS  USE SUGARLESS CANDY INSTEAD (jolley ranchers or Stover's)  NO OIL BASED VITAMINS - use powdered substitutes.   Fluticasone one at automatically at bedtime    If you are satisfied with your treatment plan let your doctor know and he/she can either refill your medications or you can return here when your prescription runs out.     If in any way you are not 100% satisfied,  please tell us.  If 100% better, tell your friends!

## 2013-07-06 NOTE — Assessment & Plan Note (Signed)
Trial off acei effective 06/06/2013  > over treated 07/06/2013 on benicar 40 mg daily > change to 20 mg daily (samples of 40 mg given)  F/u primary care

## 2013-07-06 NOTE — Progress Notes (Signed)
  Subjective:    Patient ID: [name], male    DOB: 1933-03-12,MRN: [id number]   Brief patient profile:  59 yowm quit smoking  2006 during admit [facility] for nose bleeding with baseline doe dx'd as copd rx with advair referred 6/614 to pulmonary clinic  by Dr Andee Poles for severe cough and sob at rest on ACEi   Unknown Foley is primary    06/06/2013 pulmonary eval  On ACEi cc chronic mild doe x fast walk or inclines x 8 years but 2 years ago indolent new onset cough daily progressive worse since 34/2014  now 24/7 esp when lie down to point where now sleeps in recliner some better p flonase > assoc with dry hacking or min prod clear x sev tsp.  Not much better even  on prednisone and saba. Some assoc HB and sinus congestion.    07/06/2013 f/u ov/Ibeth Fahmy off acei since 06/06/13 Chief Complaint  Patient presents with  . Follow-up    Pt states cough is unchanged since the last visit.  No new co's today.  not able to identify one activity where breathing holds him back Able to lie down for the first time since April but "no better"   No obvious daytime variabilty or assoc purulent sputum or cp or chest tightness, subjective wheeze. No unusual exp hx or h/o childhood pna/ asthma or knowledge of premature birth.    Also denies any obvious fluctuation of symptoms with weather or environmental changes or other aggravating or alleviating factors except as outlined above   Current Medications, Allergies, Past Medical History, Past Surgical History, Family History, and Social History were reviewed in [employer] record.  ROS  The following are not active complaints unless bolded sore throat, dysphagia, dental problems, itching, sneezing,  nasal congestion or excess/ purulent secretions, ear ache,   fever, chills, sweats, unintended wt loss, pleuritic or exertional cp, hemoptysis,  orthopnea pnd or leg swelling, presyncope, palpitations, heartburn, abdominal pain, anorexia, nausea, vomiting,  diarrhea  or change in bowel or urinary habits, change in stools or urine, dysuria,hematuria,  rash, arthralgias, visual complaints, headache, numbness weakness or ataxia or problems with walking or coordination,  change in mood/affect or memory.            Objective:   Physical Exam  Stoic wm nad   07/06/2013        182  Wt Readings from Last 3 Encounters:  06/06/13 183 lb (83.008 kg)  06/03/12 191 lb 12.8 oz (87 kg)    HEENT mild turbinate edema.  Oropharynx no thrush or excess pnd or cobblestoning.  No JVD or cervical adenopathy. Mild accessory muscle hypertrophy. Trachea midline, nl thryroid. Chest was hyperinflated by percussion with diminished breath sounds and moderate increased exp time without wheeze. Hoover sign positive at mid inspiration. Regular rate and rhythm without murmur gallop or rub or increase P2 or edema.  Abd: no hsm, nl excursion. Ext warm without cyanosis or clubbing.    cxr one week prior to OV   No acute change       Assessment & Plan:

## 2013-07-06 NOTE — Assessment & Plan Note (Addendum)
Better off advair and acei to date c/w large component of  Classic Upper airway cough syndrome, so named because it's frequently impossible to sort out how much is  CR/sinusitis with freq throat clearing (which can be related to primary GERD)   vs  causing  secondary (" extra esophageal")  GERD from wide swings in gastric pressure that occur with throat clearing, often  promoting self use of mint and menthol lozenges that reduce the lower esophageal sphincter tone and exacerbate the problem further in a cyclical fashion.   These are the same pts (now being labeled as having "irritable larynx syndrome" by some cough centers) who not infrequently have a history of having failed to tolerate ace inhibitors,  dry powder inhalers like advair or biphosphonates or report having atypical reflux symptoms that don't respond to standard doses of PPI , and are easily confused as having aecopd or asthma flares by even experienced allergists/ pulmonologists.     The cough is improving but not eliminated at this point so need to rx with max gerd rx and regroup in 4 weeks if not better to his satisfaction   I had an extended discussion with the patient and wife today lasting 15 to 20 minutes of a 25 minute visit on the following issues:  As I explained to this patient in detail:  although there may be significant copd present, it does not appear to be limiting activity tolerance any more than a set of worn tires limits someone from driving a car  around a parking lot.  A new set of Michelins might look good but would have no perceived impact on the performance of the car and would not be worth the cost.  That is to say:   this pt is so sedentary I don't recommend aggressive pulmonary rx at this point unless limiting symptoms arise or acute exacerbations become as issue, neither of which are the case now.  I asked the patient to contact this office at any time in the future should either of these problems arise.

## 2013-10-21 ENCOUNTER — Ambulatory Visit (INDEPENDENT_AMBULATORY_CARE_PROVIDER_SITE_OTHER): Payer: Medicare Other | Admitting: Podiatry

## 2013-10-21 ENCOUNTER — Encounter: Payer: Self-pay | Admitting: Podiatry

## 2013-10-21 VITALS — BP 120/61 | HR 65 | Resp 20 | Ht 74.0 in | Wt 182.0 lb

## 2013-10-21 DIAGNOSIS — M79609 Pain in unspecified limb: Secondary | ICD-10-CM

## 2013-10-21 DIAGNOSIS — B351 Tinea unguium: Secondary | ICD-10-CM

## 2013-10-21 DIAGNOSIS — M2040 Other hammer toe(s) (acquired), unspecified foot: Secondary | ICD-10-CM

## 2013-10-23 NOTE — Progress Notes (Signed)
Subjective:     Patient ID: [name], male   DOB: Jul 22, 1933, 79 y.o.   MRN: [id number]  HPI patient presents with painful nailbeds 1-5 both feet that has been ongoing   Review of Systems     Objective:   Physical Exam  Nursing note and vitals reviewed. Constitutional: He is oriented to person, place, and time.  Musculoskeletal: Normal range of motion.  Neurological: He is oriented to person, place, and time.   patient has elongated irritated nailbeds with pain 1-5 of both feet     Assessment:     Mycotic nail infection 1-5 both feet    Plan:     Debridement nailbeds 1-5 both feet

## 2013-11-29 ENCOUNTER — Ambulatory Visit: Payer: Medicare Other | Admitting: Podiatry

## 2013-11-29 ENCOUNTER — Encounter: Payer: Self-pay | Admitting: Podiatry

## 2013-11-29 DIAGNOSIS — B351 Tinea unguium: Secondary | ICD-10-CM

## 2013-11-30 NOTE — Progress Notes (Signed)
Subjective:     Patient ID: [name], male   DOB: [birth year], 79 y.o.   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 Reappoint for recheck of these and routine diabetic care

## 2014-01-20 ENCOUNTER — Ambulatory Visit: Payer: Medicare Other | Admitting: Podiatry

## 2014-01-27 ENCOUNTER — Ambulatory Visit: Payer: Medicare Other | Admitting: Podiatry

## 2014-02-03 ENCOUNTER — Ambulatory Visit (INDEPENDENT_AMBULATORY_CARE_PROVIDER_SITE_OTHER): Payer: Medicare Other | Admitting: Podiatry

## 2014-02-03 ENCOUNTER — Encounter: Payer: Self-pay | Admitting: Podiatry

## 2014-02-03 VITALS — BP 136/68 | HR 65 | Resp 18

## 2014-02-03 DIAGNOSIS — B351 Tinea unguium: Secondary | ICD-10-CM

## 2014-02-03 DIAGNOSIS — M2040 Other hammer toe(s) (acquired), unspecified foot: Secondary | ICD-10-CM

## 2014-02-03 DIAGNOSIS — M79609 Pain in unspecified limb: Secondary | ICD-10-CM

## 2014-02-03 DIAGNOSIS — E1159 Type 2 diabetes mellitus with other circulatory complications: Secondary | ICD-10-CM

## 2014-02-03 NOTE — Progress Notes (Signed)
Subjective:     Patient ID: [name], male   DOB: 09/20/1933, 80 y.o.   MRN: [id number]  HPI patient presents with caregiver with significant digital deformity of both feet and distal nail disease of both feet with pain   Review of Systems     Objective:   Physical Exam Neurovascular status unchanged with diabetes as a complicating factor and incurvated nailbeds 1-5 both feet they become painful along with digital deformities and vascular and neurological disease noted    Assessment:     Mycotic nail infection that are painful and at risk diabetic foot condition    Plan:     Dispensed diabetic shoes with all instructions and debridement nailbeds 1-5 bilateral with no iatrogenic bleeding noted

## 2014-02-03 NOTE — Progress Notes (Signed)
Pick up diabetic shoes and trim toenails

## 2014-03-09 DIAGNOSIS — G4733 Obstructive sleep apnea (adult) (pediatric): Secondary | ICD-10-CM | POA: Insufficient documentation

## 2014-03-09 DIAGNOSIS — E119 Type 2 diabetes mellitus without complications: Secondary | ICD-10-CM | POA: Insufficient documentation

## 2014-03-09 DIAGNOSIS — F0390 Unspecified dementia without behavioral disturbance: Secondary | ICD-10-CM | POA: Insufficient documentation

## 2014-03-09 DIAGNOSIS — E7800 Pure hypercholesterolemia, unspecified: Secondary | ICD-10-CM | POA: Insufficient documentation

## 2014-03-31 ENCOUNTER — Inpatient Hospital Stay: Payer: Self-pay | Admitting: Internal Medicine

## 2014-03-31 LAB — COMPREHENSIVE METABOLIC PANEL
ALT: 35 U/L (ref 12–78)
Albumin: 4.1 g/dL (ref 3.4–5.0)
Alkaline Phosphatase: 102 U/L
Anion Gap: 7 (ref 7–16)
BILIRUBIN TOTAL: 0.6 mg/dL (ref 0.2–1.0)
BUN: 51 mg/dL — ABNORMAL HIGH (ref 7–18)
CHLORIDE: 106 mmol/L (ref 98–107)
Calcium, Total: 8.8 mg/dL (ref 8.5–10.1)
Co2: 21 mmol/L (ref 21–32)
Creatinine: 3.38 mg/dL — ABNORMAL HIGH (ref 0.60–1.30)
EGFR (African American): 19 — ABNORMAL LOW
EGFR (Non-African Amer.): 16 — ABNORMAL LOW
GLUCOSE: 125 mg/dL — AB (ref 65–99)
Osmolality: 283 (ref 275–301)
POTASSIUM: 4.1 mmol/L (ref 3.5–5.1)
SGOT(AST): 42 U/L — ABNORMAL HIGH (ref 15–37)
Sodium: 134 mmol/L — ABNORMAL LOW (ref 136–145)
TOTAL PROTEIN: 8.3 g/dL — AB (ref 6.4–8.2)

## 2014-03-31 LAB — URINALYSIS, COMPLETE
BILIRUBIN, UR: NEGATIVE
Bacteria: NONE SEEN
Blood: NEGATIVE
Glucose,UR: NEGATIVE mg/dL (ref 0–75)
Granular Cast: 2
Hyaline Cast: 3
Ketone: NEGATIVE
Nitrite: NEGATIVE
Ph: 5 (ref 4.5–8.0)
Protein: NEGATIVE
Specific Gravity: 1.021 (ref 1.003–1.030)
Squamous Epithelial: 1
WBC UR: 3 /HPF (ref 0–5)

## 2014-03-31 LAB — CBC WITH DIFFERENTIAL/PLATELET
BASOS ABS: 0 10*3/uL (ref 0.0–0.1)
Basophil %: 0.2 %
EOS ABS: 0 10*3/uL (ref 0.0–0.7)
Eosinophil %: 0.1 %
HCT: 44.6 % (ref 40.0–52.0)
HGB: 14.8 g/dL (ref 13.0–18.0)
Lymphocyte #: 0.6 10*3/uL — ABNORMAL LOW (ref 1.0–3.6)
Lymphocyte %: 7.9 %
MCH: 31.2 pg (ref 26.0–34.0)
MCHC: 33.1 g/dL (ref 32.0–36.0)
MCV: 94 fL (ref 80–100)
MONOS PCT: 12.8 %
Monocyte #: 0.9 x10 3/mm (ref 0.2–1.0)
Neutrophil #: 5.7 10*3/uL (ref 1.4–6.5)
Neutrophil %: 79 %
Platelet: 141 10*3/uL — ABNORMAL LOW (ref 150–440)
RBC: 4.73 10*6/uL (ref 4.40–5.90)
RDW: 14 % (ref 11.5–14.5)
WBC: 7.2 10*3/uL (ref 3.8–10.6)

## 2014-04-01 LAB — CBC WITH DIFFERENTIAL/PLATELET
Basophil #: 0 10*3/uL (ref 0.0–0.1)
Basophil %: 0.1 %
EOS ABS: 0 10*3/uL (ref 0.0–0.7)
Eosinophil %: 0 %
HCT: 37.7 % — ABNORMAL LOW (ref 40.0–52.0)
HGB: 12.7 g/dL — AB (ref 13.0–18.0)
Lymphocyte #: 1.1 10*3/uL (ref 1.0–3.6)
Lymphocyte %: 16 %
MCH: 31.4 pg (ref 26.0–34.0)
MCHC: 33.7 g/dL (ref 32.0–36.0)
MCV: 93 fL (ref 80–100)
Monocyte #: 0.8 x10 3/mm (ref 0.2–1.0)
Monocyte %: 12.5 %
NEUTROS ABS: 4.8 10*3/uL (ref 1.4–6.5)
Neutrophil %: 71.4 %
Platelet: 117 10*3/uL — ABNORMAL LOW (ref 150–440)
RBC: 4.05 10*6/uL — AB (ref 4.40–5.90)
RDW: 14 % (ref 11.5–14.5)
WBC: 6.8 10*3/uL (ref 3.8–10.6)

## 2014-04-01 LAB — LIPASE, BLOOD: LIPASE: 152 U/L (ref 73–393)

## 2014-04-01 LAB — BASIC METABOLIC PANEL
ANION GAP: 7 (ref 7–16)
BUN: 51 mg/dL — ABNORMAL HIGH (ref 7–18)
CHLORIDE: 107 mmol/L (ref 98–107)
CREATININE: 2.47 mg/dL — AB (ref 0.60–1.30)
Calcium, Total: 8.2 mg/dL — ABNORMAL LOW (ref 8.5–10.1)
Co2: 20 mmol/L — ABNORMAL LOW (ref 21–32)
EGFR (Non-African Amer.): 24 — ABNORMAL LOW
GFR CALC AF AMER: 28 — AB
GLUCOSE: 107 mg/dL — AB (ref 65–99)
OSMOLALITY: 282 (ref 275–301)
POTASSIUM: 3.4 mmol/L — AB (ref 3.5–5.1)
SODIUM: 134 mmol/L — AB (ref 136–145)

## 2014-04-01 LAB — CLOSTRIDIUM DIFFICILE(ARMC)

## 2014-04-02 LAB — BASIC METABOLIC PANEL
Anion Gap: 6 — ABNORMAL LOW (ref 7–16)
BUN: 23 mg/dL — AB (ref 7–18)
Calcium, Total: 7.9 mg/dL — ABNORMAL LOW (ref 8.5–10.1)
Chloride: 111 mmol/L — ABNORMAL HIGH (ref 98–107)
Co2: 22 mmol/L (ref 21–32)
Creatinine: 1.37 mg/dL — ABNORMAL HIGH (ref 0.60–1.30)
EGFR (African American): 57 — ABNORMAL LOW
EGFR (Non-African Amer.): 49 — ABNORMAL LOW
Glucose: 92 mg/dL (ref 65–99)
Osmolality: 281 (ref 275–301)
Potassium: 4 mmol/L (ref 3.5–5.1)
Sodium: 139 mmol/L (ref 136–145)

## 2014-04-02 LAB — CBC WITH DIFFERENTIAL/PLATELET
Basophil #: 0 10*3/uL (ref 0.0–0.1)
Basophil %: 0.4 %
EOS PCT: 1.3 %
Eosinophil #: 0.1 10*3/uL (ref 0.0–0.7)
HCT: 36.4 % — ABNORMAL LOW (ref 40.0–52.0)
HGB: 11.7 g/dL — ABNORMAL LOW (ref 13.0–18.0)
LYMPHS PCT: 23.8 %
Lymphocyte #: 1.3 10*3/uL (ref 1.0–3.6)
MCH: 30 pg (ref 26.0–34.0)
MCHC: 32 g/dL (ref 32.0–36.0)
MCV: 94 fL (ref 80–100)
MONO ABS: 0.5 x10 3/mm (ref 0.2–1.0)
Monocyte %: 9.7 %
Neutrophil #: 3.5 10*3/uL (ref 1.4–6.5)
Neutrophil %: 64.8 %
PLATELETS: 99 10*3/uL — AB (ref 150–440)
RBC: 3.88 10*6/uL — ABNORMAL LOW (ref 4.40–5.90)
RDW: 13.6 % (ref 11.5–14.5)
WBC: 5.4 10*3/uL (ref 3.8–10.6)

## 2014-04-02 LAB — URINE CULTURE

## 2014-04-03 LAB — BASIC METABOLIC PANEL
Anion Gap: 6 — ABNORMAL LOW (ref 7–16)
BUN: 14 mg/dL (ref 7–18)
CHLORIDE: 111 mmol/L — AB (ref 98–107)
CO2: 22 mmol/L (ref 21–32)
CREATININE: 1.09 mg/dL (ref 0.60–1.30)
Calcium, Total: 7.9 mg/dL — ABNORMAL LOW (ref 8.5–10.1)
EGFR (African American): >60
EGFR (Non-African Amer.): >60
Glucose: 107 mg/dL — ABNORMAL HIGH (ref 65–99)
Osmolality: 278 (ref 275–301)
Potassium: 3.3 mmol/L — ABNORMAL LOW (ref 3.5–5.1)
Sodium: 139 mmol/L (ref 136–145)

## 2014-04-03 LAB — CBC WITH DIFFERENTIAL/PLATELET
Basophil #: 0 10*3/uL (ref 0.0–0.1)
Basophil %: 0.3 %
EOS PCT: 2.8 %
Eosinophil #: 0.1 10*3/uL (ref 0.0–0.7)
HCT: 34.4 % — AB (ref 40.0–52.0)
HGB: 11.3 g/dL — AB (ref 13.0–18.0)
LYMPHS ABS: 1.3 10*3/uL (ref 1.0–3.6)
LYMPHS PCT: 24.2 %
MCH: 30.3 pg (ref 26.0–34.0)
MCHC: 32.9 g/dL (ref 32.0–36.0)
MCV: 92 fL (ref 80–100)
MONO ABS: 0.5 x10 3/mm (ref 0.2–1.0)
MONOS PCT: 8.5 %
Neutrophil #: 3.4 10*3/uL (ref 1.4–6.5)
Neutrophil %: 64.2 %
Platelet: 109 10*3/uL — ABNORMAL LOW (ref 150–440)
RBC: 3.73 10*6/uL — ABNORMAL LOW (ref 4.40–5.90)
RDW: 13.4 % (ref 11.5–14.5)
WBC: 5.4 10*3/uL (ref 3.8–10.6)

## 2014-04-04 LAB — CBC WITH DIFFERENTIAL/PLATELET
BASOS PCT: 0.3 %
Basophil #: 0 10*3/uL (ref 0.0–0.1)
EOS ABS: 0.2 10*3/uL (ref 0.0–0.7)
Eosinophil %: 3.3 %
HCT: 35.1 % — AB (ref 40.0–52.0)
HGB: 12.3 g/dL — AB (ref 13.0–18.0)
LYMPHS ABS: 1.7 10*3/uL (ref 1.0–3.6)
Lymphocyte %: 29.7 %
MCH: 31.6 pg (ref 26.0–34.0)
MCHC: 35 g/dL (ref 32.0–36.0)
MCV: 90 fL (ref 80–100)
MONOS PCT: 9 %
Monocyte #: 0.5 x10 3/mm (ref 0.2–1.0)
NEUTROS ABS: 3.4 10*3/uL (ref 1.4–6.5)
Neutrophil %: 57.7 %
PLATELETS: 127 10*3/uL — AB (ref 150–440)
RBC: 3.89 10*6/uL — AB (ref 4.40–5.90)
RDW: 13.5 % (ref 11.5–14.5)
WBC: 5.9 10*3/uL (ref 3.8–10.6)

## 2014-04-04 LAB — POTASSIUM: Potassium: 3.2 mmol/L — ABNORMAL LOW (ref 3.5–5.1)

## 2014-04-05 LAB — CULTURE, BLOOD (SINGLE)

## 2014-04-06 LAB — STOOL CULTURE

## 2014-04-29 ENCOUNTER — Emergency Department: Payer: Self-pay | Admitting: Emergency Medicine

## 2014-04-29 LAB — TROPONIN I

## 2014-04-29 LAB — URINALYSIS, COMPLETE
BILIRUBIN, UR: NEGATIVE
Bacteria: NONE SEEN
Blood: NEGATIVE
Glucose,UR: NEGATIVE mg/dL (ref 0–75)
KETONE: NEGATIVE
Leukocyte Esterase: NEGATIVE
Nitrite: NEGATIVE
Ph: 8 (ref 4.5–8.0)
Protein: NEGATIVE
RBC,UR: 1 /HPF (ref 0–5)
Specific Gravity: 1.012 (ref 1.003–1.030)
Squamous Epithelial: NONE SEEN
WBC UR: <1 /HPF (ref 0–5)

## 2014-04-29 LAB — BASIC METABOLIC PANEL
ANION GAP: 7 (ref 7–16)
BUN: 16 mg/dL (ref 7–18)
CALCIUM: 9.4 mg/dL (ref 8.5–10.1)
CO2: 26 mmol/L (ref 21–32)
Chloride: 104 mmol/L (ref 98–107)
Creatinine: 1.38 mg/dL — ABNORMAL HIGH (ref 0.60–1.30)
EGFR (African American): 56 — ABNORMAL LOW
EGFR (Non-African Amer.): 49 — ABNORMAL LOW
Glucose: 125 mg/dL — ABNORMAL HIGH (ref 65–99)
OSMOLALITY: 276 (ref 275–301)
Potassium: 4.8 mmol/L (ref 3.5–5.1)
SODIUM: 137 mmol/L (ref 136–145)

## 2014-04-29 LAB — CBC
HCT: 42.1 % (ref 40.0–52.0)
HGB: 14 g/dL (ref 13.0–18.0)
MCH: 30.6 pg (ref 26.0–34.0)
MCHC: 33.2 g/dL (ref 32.0–36.0)
MCV: 92 fL (ref 80–100)
Platelet: 153 10*3/uL (ref 150–440)
RBC: 4.57 10*6/uL (ref 4.40–5.90)
RDW: 14 % (ref 11.5–14.5)
WBC: 10.7 10*3/uL — AB (ref 3.8–10.6)

## 2014-05-05 ENCOUNTER — Ambulatory Visit: Payer: Medicare Other | Admitting: Podiatry

## 2014-05-12 ENCOUNTER — Ambulatory Visit (INDEPENDENT_AMBULATORY_CARE_PROVIDER_SITE_OTHER): Payer: Medicare Other | Admitting: Podiatry

## 2014-05-12 VITALS — BP 131/73 | HR 67 | Resp 16

## 2014-05-12 DIAGNOSIS — B351 Tinea unguium: Secondary | ICD-10-CM

## 2014-05-12 DIAGNOSIS — M79609 Pain in unspecified limb: Secondary | ICD-10-CM

## 2014-05-12 NOTE — Progress Notes (Signed)
Subjective:     Patient ID: [name], male   DOB: 1933-05-14, 80 y.o.   MRN: [id number]  HPI patient presents with painful yellow nailbeds that become very sore in the corners and the and he cannot cut   Review of Systems     Objective:   Physical Exam Neurovascular status intact with some memory loss and found to have thick incurvated the long gaited nailbeds that cut into the corners to create pain    Assessment:     Chronic mycotic nail infection with pain    Plan:     Debridement painful nail bed 1-5 both feet with no iatrogenic bleeding noted

## 2014-06-01 ENCOUNTER — Encounter: Payer: Self-pay | Admitting: Internal Medicine

## 2014-06-21 ENCOUNTER — Encounter: Payer: Self-pay | Admitting: Internal Medicine

## 2014-07-22 ENCOUNTER — Encounter: Payer: Self-pay | Admitting: Internal Medicine

## 2014-07-28 DIAGNOSIS — N200 Calculus of kidney: Secondary | ICD-10-CM | POA: Insufficient documentation

## 2014-07-28 DIAGNOSIS — Z9889 Other specified postprocedural states: Secondary | ICD-10-CM | POA: Insufficient documentation

## 2014-07-28 DIAGNOSIS — K5790 Diverticulosis of intestine, part unspecified, without perforation or abscess without bleeding: Secondary | ICD-10-CM | POA: Insufficient documentation

## 2014-07-28 DIAGNOSIS — D696 Thrombocytopenia, unspecified: Secondary | ICD-10-CM | POA: Insufficient documentation

## 2014-07-28 DIAGNOSIS — I739 Peripheral vascular disease, unspecified: Secondary | ICD-10-CM | POA: Insufficient documentation

## 2014-07-28 DIAGNOSIS — I2510 Atherosclerotic heart disease of native coronary artery without angina pectoris: Secondary | ICD-10-CM | POA: Insufficient documentation

## 2014-08-11 ENCOUNTER — Ambulatory Visit (INDEPENDENT_AMBULATORY_CARE_PROVIDER_SITE_OTHER): Payer: Medicare Other | Admitting: Podiatry

## 2014-08-11 DIAGNOSIS — M79609 Pain in unspecified limb: Secondary | ICD-10-CM

## 2014-08-11 DIAGNOSIS — M79676 Pain in unspecified toe(s): Secondary | ICD-10-CM

## 2014-08-11 DIAGNOSIS — B351 Tinea unguium: Secondary | ICD-10-CM

## 2014-08-13 NOTE — Progress Notes (Signed)
Subjective:     Patient ID: [name]   DOB: October 03, 1933, 80 y.o.   MRN: [id number]  HPI patient presents with nail disease 1-5 both feet that are painful and he cannot cut   Review of Systems     Objective:   Physical Exam Neurovascular status intact with thick yellow brittle nailbeds 1-5 both feet that are painful    Assessment:     Mycotic nail infection is with pain 1-5 both feet    Plan:     Debridement of painful nailbeds 1-5 both feet with no iatrogenic bleeding noted

## 2014-11-10 ENCOUNTER — Other Ambulatory Visit: Payer: Medicare Other

## 2014-11-14 ENCOUNTER — Ambulatory Visit (INDEPENDENT_AMBULATORY_CARE_PROVIDER_SITE_OTHER): Payer: Medicare Other | Admitting: Podiatry

## 2014-11-14 DIAGNOSIS — B351 Tinea unguium: Secondary | ICD-10-CM

## 2014-11-14 DIAGNOSIS — M79676 Pain in unspecified toe(s): Secondary | ICD-10-CM

## 2014-11-14 NOTE — Progress Notes (Signed)
Subjective:     Patient ID: [name], male   DOB: 12/06/1933, 80 y.o.   MRN: [id number]  HPI patient presents with nail disease 1-5 both feet that are painful and he cannot cut   Review of Systems     Objective:   Physical Exam Neurovascular status intact with thick yellow brittle nailbeds 1-5 both feet that are painful    Assessment:     Mycotic nail infection is with pain 1-5 both feet    Plan:     Debridement of painful nailbeds 1-5 both feet with no iatrogenic bleeding noted. Also discussed diabetic shoes which she'll be eligible for at his next visit after the first of the year which have been beneficial for him long-term.

## 2015-01-25 DIAGNOSIS — N183 Chronic kidney disease, stage 3 unspecified: Secondary | ICD-10-CM | POA: Insufficient documentation

## 2015-01-25 DIAGNOSIS — G20 Parkinson's disease: Secondary | ICD-10-CM | POA: Insufficient documentation

## 2015-02-16 ENCOUNTER — Ambulatory Visit (INDEPENDENT_AMBULATORY_CARE_PROVIDER_SITE_OTHER): Payer: Medicare Other | Admitting: Podiatry

## 2015-02-16 DIAGNOSIS — M79676 Pain in unspecified toe(s): Secondary | ICD-10-CM | POA: Diagnosis not present

## 2015-02-16 DIAGNOSIS — B351 Tinea unguium: Secondary | ICD-10-CM | POA: Diagnosis not present

## 2015-02-16 NOTE — Progress Notes (Signed)
Subjective:     Patient ID: [name], male   DOB: 07/23/1933, 81 y.o.   MRN: [id number]  HPI patient presents with nail disease 1-5 both feet that are painful and he cannot cut   Review of Systems     Objective:   Physical Exam Neurovascular status intact with thick yellow brittle nailbeds 1-5 both feet that are painful    Assessment:     Mycotic nail infection is with pain 1-5 both feet    Plan:     Debridement of painful nailbeds 1-5 both feet with no iatrogenic bleeding noted. Also discussed diabetic shoes which she'll be eligible for at his next visit after the first of the year which have been beneficial for him long-term.      

## 2015-04-10 NOTE — Discharge Summary (Signed)
PATIENT NAME:  [name], [name] MR#:  [id number] DATE OF BIRTH:  [birth year]  DATE OF ADMISSION:  12/03/2012 DATE OF DISCHARGE:  12/07/2012  FINAL DIAGNOSES:  1.  Metabolic encephalopathy.  2.  Coronary artery disease.  3.  Hypertension.  4.  Diabetes.  5.  Peripheral vascular disease.  6.  Chronic kidney disease stage III. 7.  Sleep apnea, untreated secondary to the patient's refusal to wear CPAP.   HISTORY AND PHYSICAL: Please see dictated admission history and physical.   SUMMARY OF HOSPITAL COURSE: The patient was admitted with confusion and lethargy. He underwent extensive evaluation including CT, lab work, which was essentially unrevealing for this source. He had been on Levaquin, was taken off serotonin reuptake inhibitors, and his mental status slowly cleared. He was seen by psychiatry and neurology. He underwent EEG, which revealed diffuse slowing. His mental status appeared to improve each day. His physical activity improved as well. Physical therapy worked with the patient, and on day of discharge, he actually ambulated 700 feet and his mental status appeared to be much better. His wife was present and she agreed with this. We discussed that we were unable to identify the exact source of his mental status change, however, that this may be a common consequence of multiple issues in a patient with dementia.   At this time, he will be discharged to home in stable condition with his physical activity up as tolerated. Home health nursing was ordered to monitor his mental status and his blood pressure, and home occupational therapy to monitor his dementia and to see whether other safety issues are developing at home and to improve his activities of daily living. We will anticipate him returning to us within the next 2 weeks. Family members will contact us if there appear to be any significant changes in his overall health.   DISCHARGE MEDICATIONS:  1.  Aspirin 81 mg p.o. daily.  2.   PreserVision 1 p.o. b.i.d.  3.  Multivitamin 1 p.o. daily.  4.  Namenda 10 mg p.o. b.i.d.  5.  Lovaza 1000 mg p.o. t.i.d.  6.  Lovastatin 40 mg p.o. at bedtime, which he takes at home.  7.  Advair 250/50 1 puff daily.  8.  Ilevro 1 drop to the eye once a day.  9.  Prednisolone 1 drop to the eye, according to ophthalmology instructions.  10.  Albuterol metered-dose inhaler 2 puffs q.4 hours p.r.n. wheezing.  11.  Enalapril 20 mg p.o. b.i.d.  12.  Glipizide XL 5 mg p.o. at breakfast.  13.  Amlodipine 10 mg p.o. daily, dose increased.  14.  Meloxicam 15 mg p.o. daily as needed for arthritis pain.  15.  Toprol-XL 25 mg p.o. daily.   They were given instructions to hold citalopram. This was held during his hospitalizations and he did well without it.   ____________________________ Lynnea FerrierBert J. Reiner Loewen III, MD [email or web address] D: 12/07/2012 12:56:08 ET T: 12/07/2012 21:55:42 ET JOB#: [id number]  cc: Curtis SitesBert J. Tyshika Baldridge III, MD, <Dictator> Daniel NonesBERT Noemi Ishmael MD ELECTRONICALLY SIGNED 12/14/2012 8:13

## 2015-04-10 NOTE — Consult Note (Signed)
Brief Consult Note: Diagnosis: dementia of unknown eitiology.   Patient was seen by consultant.   Consult note dictated.   Recommend further assessment or treatment.   Comments: Psychiatry: Patient seen and chart reviewed. Recent non-specific symptoms of fatigue mostly and confusion. Patient emphasises the fatigue. Answers to questions about depression are mixed and inconsistant. Past hx unclear. Why is he on Namenda and for how long? Why and how long the celexa? Has it helped? In light of uncertainty I would not presume to start any new medication. I look forward to seeing the neuro consult. If he is in the hospital after the weekend I will check back in.  Electronic Signatures: Clapacs, Jackquline DenmarkJohn T (MD)  (Signed 13-Dec-13 17:33)  Authored: Brief Consult Note   Last Updated: 13-Dec-13 17:33 by Audery Amellapacs, John T (MD)

## 2015-04-10 NOTE — Consult Note (Signed)
Referring Physician:  Adin Hector   Primary Care Physician:  Adin Hector [facility], [address], [place], [place] [address], [place] [phone]  Reason for Consult:  Admit Date: 03-Dec-2012   Chief Complaint: AMS   Reason for Consult: altered mental status   History of Present Illness:  History of Present Illness:   HISTORY OF PRESENT ILLNESS: male with history of CAD, DM, HTN, COPD, chronic kidney disease, and sleep apnea presented to [facility] for encephalopathy of undetermined cause.  Neurology is asked to evaluate for cause of encephalopathy.  Since admission, the patient has had a HCT that was unremarkable.  He has had a thorough blood workup for common treatable causes of AMS which has been unremarkable with the exception of elevated ammonia level which has been adequately treated.  UA was negative.  He was recently treated empirically with antibiotics but this did not yield cognitive improvements.  There are some tremors noted by the primary team.  He has been more emotional lately than usual.  talking to his wife today, Collie Siad states that she feels confident that his cognition was much improved today compared to prior days.  He is acting more appropriately.  He is asking appropriate questions.  He is not getting out of bed without asking permission.  He is able to go to the toilet on his own.  Apparently, all of these appropriate activities are improvements over his recent level of function.      MEDICAL HISTORY:  1. Chronic obstructive pulmonary disease.  2. Coronary artery disease.  Diabetes mellitus.  Hyperlipidemia.  Hypertension with mild right renal artery stenosis by previous angiography.  Thrombocytopenia in the past.  Left carotid artery stenosis with medical management recommended.  Benign prostatic hypertrophy.  History of renal stones.  Degenerative disk disease.  Diverticulosis.  Chronic microscopic hematuria.  Chronic kidney disease, stage III.  Anemia.  Sleep  apnea.  Depression and anxiety.  Dementia with prior Neurology consultation.  history of neurosurgery in the past.   HISTORY:  He stopped smoking in 2006.  Rarely uses alcohol.  HISTORY:  Coronary artery disease and colon cancer.   1. Advair 250/50 one puff b.i.d.   2. Aspirin 81 mg p.o. daily.  Citalopram 20 mg p.o. daily.  Lovastatin 40 mg p.o. at bedtime.  Toprol-XL 25 mg p.o. daily.  Albuterol MDI 2 puffs 4 times a day as needed.  Enalapril 20 mg p.o. b.i.d.  PreserVision 1 p.o. daily.  Lovaza 1 gram p.o. t.i.d.  10. Amlodipine 5 mg p.o. daily.  11. Namenda 10 mg p.o. b.i.d.  Meloxicam 15 mg p.o. daily p.r.n. arthritis.  Glipizide XL 5 mg p.o. daily.  Recently finished course of Levaquin.   ALLERGIES: Atorvastatin causes myalgias.   GENERAL: NAD.  Normocephalic and atraumatic.  Funduscopic exam shows normal disc size, appearance and C/D ratio.   and S2 sounds are within normal limits, without murmurs, gallops, or rubs. - Normal- NormalDrift - Absent bilaterally.- Gait and station are not tested today since the patient is on fall precautions.      Shoulder abduction (deltoid/supraspinatus, axillary/suprascapular n, C5)   Elbow flexion (biceps brachii, musculoskeletal n, C5-6)   Elbow extension (triceps, radial n, C7)   Finger adduction (interossei, ulnar n, T1)    Hip flexion (iliopsoas, L1/L2)   Knee flexion (hamstrings, sciatic n, L5/S1)    Knee extension (quadriceps, femoral n, L3/4)   Ankle dorsiflexion (tibialis anterior, deep fibular n, L4/5)  Ankle plantarflexion (gastroc, tibial n, S1)  STATUS:is oriented to person only.  He does not know the year, month, president, city, name of hospital or correct answer to simple math questions.  He follows simple commands consistently.  Recent and remote memory are both moderately decreased.  Attention span and concentration are moderately decreased.  Naming, repetition, comprehension and expressive speech are within normal limits.  Patient's fund  of knowledge is reduced for educational level. NERVES:   CN II (normal visual acuity and visual fields)   CN III, IV, VI (extraocular muscles are intact)   CN V (facial sensation is intact bilaterally)   CN VII (facial strength is intact bilaterally)   CN VIII (hearing is intact bilaterally)   CN IX/X (palate elevates midline, normal phonation)   CN XI (shoulder shrug strength is normal and symmetric)   CN XII (tongue protrudes midline) to pain and temp bilaterally (spinothalamic tracts)to position and vibration bilaterally (dorsal columns)    Biceps   Brachioradialis    Patellar   Achilles to nose testing is within normal limits.   ROUTINE EEG - Personally interpreted.history and indication/s:  evaluate for seizures--recently more lethargic and seems confused--hx of copd, diabetes, hypertension, chronic kidney disease and sleep apnea, dementia  Evaluate for seizure. of the above-mentioned patient was acquired with [employer] equipment.  Electrodes were placed according to the internationally standardized 10-20 system, after individualized distance measurement. posterior rhythm is a 7 Hz activity that attenuates with eye opening.  The background is comprised of theta and beta range frequencies.     Photic stimulation induces a mild driving response and does not induce any abnormal activities. was not performed.   The patient enters stage II sleep as defined by the appearance of sleep spindles and vertex waves.  are no ictal or interictal activities identified.  This routine EEG in the awake state is abnormal due to mild diffuse slowing.  Diffuse slowing is indicative of bihemispheric dysfunction of nonspecific etiology which in the appropriate context could be on the basis of a toxic, metabolic or primary neuronal disorder.  Although there is no evidence of triphasic waves on this study, this does not rule out the possibility of a metabolic encephalopathy.   Clinical correlation is  advised. Melrose Nakayama, MD    81 year old male with history of CAD, DM, HTN, COPD, chronic kidney disease, and sleep apnea presented to [facility] for encephalopathy of undetermined cause.   This patient has had slightly elevated ammonia level and multiple other metabolic abnormalities since admission likely on top of a baseline level of cognitive dysfunction.  Given that his mental status and level of functionality has improved over the past 24 hours, this seems to point to a possible metabolic etiology.  I have reviewed his recent HCT which is unremarkable.  However, given his vasculopathic risk factors, it is possible that he may be suffering from vascular dementia vs degenerative dementia.   think the patient would benefit from a routine EEG to evaluate for ictal vs interictal activity, to evaluate for focal abnormalities, and also to evaluate for the possibility of metabolic triphasic waves to help narrow down the ddx of the possible etiology for his abnormal cognitive status.  I would recommend continuing to provide reorientation, minimizing night time visits to encourage a normal sleep wake cycle, and continuing with to work with physical therapy.   it is unclear to me if the patient is taking CPAP at home for his OSA and if he is receiving CPAP while  in the hospital, which could potentially interfere with his sleep and prolong a delirious state.   Melrose Nakayama, MD        ROS:   General denies complaints    HEENT no complaints    Lungs no complaints    Cardiac no complaints    GI no complaints    GU no complaints    Musculoskeletal no complaints    Extremities no complaints    Skin no complaints    Endocrine no complaints   Past Medical/Surgical Hx:  COPD:   Peripheral vascular Disease:   Chronic kidney disease:   CAD:   Moderate Dementia:   CRI:   HTN:   DM II:   cardiac cath with stents:   carotid artery stenosis:   Home Medications: Medication Instructions Last Modified  Date/Time  metoprolol succinate 50 mg oral tablet, extended release 0.5 tab(s) orally 2 times a day  13-Dec-13 19:27  glipiZIDE 5 mg oral tablet 1 tab(s) orally once a day  13-Dec-13 19:27  aspirin 81 mg oral tablet 1  orally once a day  13-Dec-13 19:27  PreserVision 1 tab(s)  2 times a day  13-Dec-13 19:27  Centrum Silver 1 tab(s)  once a day  13-Dec-13 19:27  citalopram 40 mg oral tablet 0.5 tab(s) orally once a day  13-Dec-13 19:27  Namenda 10 mg oral tablet 1 tab(s) orally 2 times a day  13-Dec-13 19:27  meloxicam 15 mg oral tablet 1 tab(s) orally once a day  13-Dec-13 19:27  Lovaza 1000 mg oral capsule 1 tab(s) orally 3 times a day  13-Dec-13 19:27  lovastatin 40 mg oral tablet 1 tab(s) orally once a day (at bedtime)  13-Dec-13 19:27  Advair Diskus 250 mcg-50 mcg inhalation powder 1 puff(s) inhaled once a day  13-Dec-13 19:27  enalapril 20 mg oral tablet 1 tab(s) orally once a day 13-Dec-13 19:27  amlodipine 5 mg oral tablet 1 tab(s) orally once a day 13-Dec-13 19:27  levofloxacin 500 mg oral tablet 1 tab(s) orally every 24 hours 13-Dec-13 19:27  Ilevro 1 drop(s) to each affected eye once a day 13-Dec-13 19:27  prednisoLONE acetate 1% ophthalmic suspension drop(s) to each affected eye  special instructions with eye gtt's 13-Dec-13 19:27   [facility] Current Meds:  Acetaminophen * tablet, ( Tylenol (325 mg) tablet)  650 mg Oral q4h PRN for pain or temp. greater than 100.4  - Indication: Pain/Fever  Albuterol Oral inhaler, 2 puff(s) Inhalation Q6H WHILE AWAKE with Spacer (Op  - Indication: Bronchodilator  Instructions:  [Waste Code: SendToRx]  Aspirin Chewable, 81 mg Oral daily  - Indication: Pain/Fever/Thromboembolic Disorders/Post MI/Prophylaxis MI  Enalapril tablet, ( Vasotec)  20 mg Oral bid  - Indication: Antihypertensive/ CHF.Marland KitchenMarland KitchenMarland KitchenPatient's Own Med  Fluticasone/Salmeterol 250/50 inhaler,  ( Advair Diskus 250/50 inhaler )  1 puff(s) Inhalation bid  Instructions:  RINSE MOUTH AFTER  USE  glipiZIDE XL tablet, ( Glucotrol XL)  5 mg Oral ac/break  - Indication: Diabetes  Instructions:  Give 30 minutes before meals--DO NOT CRUSH; hold if NPO...Marland KitchenPatient's Own Med  Insulin SS -Novolin R injection, Subcutaneous, FSBS before meals and at bedtime  if FSBS 0-200, no insulin     4 unit(s) if FSBS 201 - 250     6 unit(s) if FSBS 251 - 300     8 unit(s) if FSBS 301 - 350     10 unit(s) if FSBS 351 - 400  Call MD if FSBS greater than 400, [Waste Code: Black]  Memantine tablet,  ( Namenda)  10 mg Oral bid  - Indication: Dementia of the Alzheimer's type.Marland KitchenMarland KitchenMarland KitchenPatient's Own Med  meTOProlol succinate ER tablet,  ( Toprol XL)  25 mg Oral daily  - Indication: Antihypertensive/ Angina  Instructions:  - DO NOT CRUSH....Patient's Own Med  Multivitamin with minerals tablet,  ( Preservision (multivitamin))  1 tablet(s) Oral daily  - Indication: vitamin supplement.Marland KitchenMarland KitchenMarland KitchenPatient's Own Med  OptiChamber, 1 each Inhalation ud PRN for use with oral inhaler  Pantoprazole tablet, 40 mg Oral daily  - Indication: Erosive Esophagitis/ GERD  Instructions:  DO NOT CRUSH  Sodium Chloride 0.9% injection, 2 ml, IV push, q6h  HePARin injection, 5000 unit(s), Subcutaneous, q12h  Indication: Anticoagulant, Monitor Anticoags per hospital protocol  Non-Formulary Medication, ilevro  1 drop(s) Both Eyes daily  Instructions:  until bottle is empty.Marland KitchenMarland KitchenPatient's Own Med  Non-Formulary Medication, prednisolone 1%  1 drop(s) Both Eyes tid.Marland KitchenMarland KitchenPatient's Own Med  amLODIPine tablet, ( Norvasc)  10 mg Oral daily  - Indication: Hypertension/ Angina  Allergies:  Lipitor: Muscles aches  Vital Signs: **Vital Signs.:   16-Dec-13 13:25   Vital Signs Type Q 4hr   Temperature Temperature (F) 97.3   Celsius 36.2   Temperature Source Oral   Pulse Pulse 59   Respirations Respirations 18   Systolic BP Systolic BP 814   Diastolic BP (mmHg) Diastolic BP (mmHg) 70   Mean BP 97   Pulse Ox % Pulse Ox % 98   Pulse Ox  Activity Level  At rest   Oxygen Delivery Room Air/ 21 %   Lab Results:  Thyroid:  13-Dec-13 15:38    Thyroid Stimulating Hormone 2.63 (0.45-4.50 (International Unit)  ----------------------- Pregnant patients have  different reference  ranges for TSH:  - - - - - - - - - -  Pregnant, first trimetser:  0.36 - 2.50 uIU/mL)  Hepatic:  13-Dec-13 15:38    Bilirubin, Total 0.7   Alkaline Phosphatase 104   SGPT (ALT) 24   SGOT (AST) 28   Total Protein, Serum 7.9   Albumin, Serum 3.7  Routine Micro:  13-Dec-13 15:38    Micro Text Report BLOOD CULTURE   COMMENT                   NO GROWTH IN 36 HOURS   ANTIBIOTIC                        Culture Comment NO GROWTH IN 36 HOURS  Result(s) reported on 05 Dec 2012 at 07:46AM.    15:45    Micro Text Report BLOOD CULTURE   COMMENT                   NO GROWTH IN 36 HOURS   ANTIBIOTIC                        Culture Comment NO GROWTH IN 36 HOURS  Result(s) reported on 05 Dec 2012 at 07:46AM.    20:21    Micro Text Report URINE CULTURE   COMMENT                   NO GROWTH IN 36 HOURS   ANTIBIOTIC                        Specimen Source CC   Culture Comment NO GROWTH IN 36 HOURS  Result(s) reported on 05 Dec 2012  at 11:51AM.  Lab:  13-Dec-13 14:38    pH (ABG)  7.46   PCO2  29   PO2 86   FiO2 21   Base Excess -2.1   HCO3  20.6   O2 Saturation 97.1   O2 Device room air   Specimen Site (ABG) RT RADIAL   Specimen Type (ABG) ARTERIAL   Patient Temp (ABG) 37.0 (Result(s) reported on 03 Dec 2012 at 02:43PM.)  General Ref:  13-Dec-13 15:38    Vitamin B12, Serum ========== TEST NAME ==========  ========= RESULTS =========  = REFERENCE RANGE =  VITAMIN B12  Vitamin B12 Vitamin B12                     [   912 pg/mL            ]           [id number]               [facility]            No: [id number]           [address], [place], [place] [address]           Lindon Romp, MD         [phone]   Result(s)  reported on 04 Dec 2012 at 08:46AM.   RPR (STS) ========== TEST NAME ==========  ========= RESULTS =========  = REFERENCE RANGE =  RPR  RPR RPR                             [   Non Reactive         ]      Non Reactive               [facility]            No: [id number]           [address], [place], [place] [address]           Lindon Romp, MD         [phone]   Result(s) reported on 04 Dec 2012 at 08:46AM.  16-Dec-13 03:01    Vitamin B12, Serum ========== TEST NAME ==========  ========= RESULTS =========  = REFERENCE RANGE =  VITAMIN B12  Vitamin B12 Vitamin B12                     [   569 pg/mL            ]           [id number]               [facility]            No: [id number]           [address], [place], [place] [address]           Lindon Romp, MD         [phone]   Result(s) reported on 07 Dec 2012 at 06:19AM.  Cardiology:  13-Dec-13 13:40    Ventricular Rate 55   Atrial Rate 55   P-R Interval 206   QRS Duration 90   QT 420   QTc 401   P Axis 34   R Axis 27   T Axis 37   ECG interpretation Sinus bradycardia with sinus arrhythmia T wave  abnormality, consider anterior ischemia Abnormal ECG When compared with ECG of 18-Nov-2005 12:32, Non-specific change in ST segment in Anterior leads T wave inversion now evident in Anterior leads Confirmed by MORAYATI, SAM ([id number]) on 12/05/2012 3:34:21 PM  Overreader: Hipolito Bayley  Routine Chem:  13-Dec-13 15:38    Glucose, Serum  100   BUN  20   Creatinine (comp)  1.52   Sodium, Serum 138   Potassium, Serum 3.8   Chloride, Serum 106   CO2, Serum 24   Calcium (Total), Serum 9.1   Anion Gap 8   Osmolality (calc) 278   eGFR (African American)  50   eGFR (Non-African American)  44 (eGFR values <28m/min/1.73 m2 may be an indication of chronic kidney disease (CKD). Calculated eGFR is useful in patients with stable renal function. The eGFR calculation will not be reliable in  acutely ill patients when serum creatinine is changing rapidly. It is not useful in  patients on dialysis. The eGFR calculation may not be applicable to patients at the low and high extremes of body sizes, pregnant women, and vegetarians.)  14-Dec-13 05:02    Glucose, Serum 83   BUN  19   Creatinine (comp)  1.32   Sodium, Serum 138   Potassium, Serum 4.5   Chloride, Serum 106   CO2, Serum 23   Calcium (Total), Serum 8.9   Anion Gap 9   Osmolality (calc) 277   eGFR (African American)  60   eGFR (Non-African American)  52 (eGFR values <652mmin/1.73 m2 may be an indication of chronic kidney disease (CKD). Calculated eGFR is useful in patients with stable renal function. The eGFR calculation will not be reliable in acutely ill patients when serum creatinine is changing rapidly. It is not useful in  patients on dialysis. The eGFR calculation may not be applicable to patients at the low and high extremes of body sizes, pregnant women, and vegetarians.)   Result Comment CPK - Slight hemolysis, interpret results with  - caution.  Result(s) reported on 04 Dec 2012 at 06[facility]  Result Comment POTASSIUM - Slight hemolysis, interpret results with  - caution.  Result(s) reported on 04 Dec 2012 at 05:29AM.  16-Dec-13 03:01    Glucose, Serum  110   BUN  20   Creatinine (comp)  1.49   Sodium, Serum  134   Potassium, Serum 3.6   Chloride, Serum 100   CO2, Serum 27   Calcium (Total), Serum 8.8   Anion Gap 7   Osmolality (calc) 271   eGFR (African American)  52   eGFR (Non-African American)  45 (eGFR values <6057min/1.73 m2 may be an indication of chronic kidney disease (CKD). Calculated eGFR is useful in patients with stable renal function. The eGFR calculation will not be reliable in acutely ill patients when serum creatinine is changing rapidly. It is not useful in  patients on dialysis. The eGFR calculation may not be applicable to patients at the low and high extremes of body  sizes, pregnant women, and vegetarians.)  Cardiac:  13-Dec-13 15:38    CK, Total 52   CPK-MB, Serum 1.0 (Result(s) reported on 03 Dec 2012 at 04:34PM.)   Troponin I < 0.02 (0.00-0.05 0.05 ng/mL or less: NEGATIVE  Repeat testing in 3-6 hrs  if clinically indicated. >0.05 ng/mL: POTENTIAL  MYOCARDIAL INJURY. Repeat  testing in 3-6 hrs if  clinically indicated. NOTE: An increase or decrease  of 30% or more on serial  testing suggests a  clinically important  change)    22:32    CK, Total 49   CPK-MB, Serum 0.7 (Result(s) reported on 03 Dec 2012 at 11:08PM.)   Troponin I < 0.02 (0.00-0.05 0.05 ng/mL or less: NEGATIVE  Repeat testing in 3-6 hrs  if clinically indicated. >0.05 ng/mL: POTENTIAL  MYOCARDIAL INJURY. Repeat  testing in 3-6 hrs if  clinically indicated. NOTE: An increase or decrease  of 30% or more on serial  testing suggests a  clinically important change)  14-Dec-13 05:02    CK, Total 70   CPK-MB, Serum 1.0   Troponin I < 0.02 (0.00-0.05 0.05 ng/mL or less: NEGATIVE  Repeat testing in 3-6 hrs  if clinically indicated. >0.05 ng/mL: POTENTIAL  MYOCARDIAL INJURY. Repeat  testing in 3-6 hrs if  clinically indicated. NOTE: An increase or decrease  of 30% or more on serial  testing suggests a  clinically important change)  Routine UA:  13-Dec-13 20:21    Color (UA) Colorless   Clarity (UA) Clear   Glucose (UA) Negative   Bilirubin (UA) Negative   Ketones (UA) Negative   Specific Gravity (UA) 1.000   Blood (UA) Negative   pH (UA) 7.0   Protein (UA) Negative   Nitrite (UA) Negative   Leukocyte Esterase (UA) Negative (Result(s) reported on 03 Dec 2012 at 09:43PM.)   RBC (UA) <1 /HPF   WBC (UA) <1 /HPF   Bacteria (UA) NONE SEEN   Epithelial Cells (UA) NONE SEEN  Result(s) reported on 03 Dec 2012 at 09:43PM.  Routine Hem:  13-Dec-13 15:38    WBC (CBC) 8.6   RBC (CBC)  4.34   Hemoglobin (CBC) 14.2   Hematocrit (CBC) 40.7   Platelet Count (CBC) 162    MCV 94   MCH 32.7   MCHC 34.8   RDW 13.5   Neutrophil % 60.8   Lymphocyte % 29.1   Monocyte % 6.4   Eosinophil % 3.0   Basophil % 0.7   Neutrophil # 5.3   Lymphocyte # 2.5   Monocyte # 0.6   Eosinophil # 0.3   Basophil # 0.1 (Result(s) reported on 03 Dec 2012 at 04:19PM.)   Erythrocyte Sed Rate 16 (Result(s) reported on 03 Dec 2012 at 04:59PM.)  14-Dec-13 05:02    WBC (CBC) 6.3   RBC (CBC)  4.02   Hemoglobin (CBC) 13.2   Hematocrit (CBC)  37.6   Platelet Count (CBC)  147   MCV 93   MCH 32.9   MCHC 35.2   RDW 13.2   Neutrophil % 49.1   Lymphocyte % 36.9   Monocyte % 9.1   Eosinophil % 4.1   Basophil % 0.8   Neutrophil # 3.1   Lymphocyte # 2.3   Monocyte # 0.6   Eosinophil # 0.3   Basophil # 0.1 (Result(s) reported on 04 Dec 2012 at 05:29AM.)  16-Dec-13 03:01    WBC (CBC) 6.7   RBC (CBC)  4.07   Hemoglobin (CBC) 13.5   Hematocrit (CBC)  37.6   Platelet Count (CBC)  148   MCV 93   MCH 33.2   MCHC 35.9   RDW 13.2   Neutrophil % 59.3   Lymphocyte % 27.3   Monocyte % 9.6   Eosinophil % 3.3   Basophil % 0.5   Neutrophil # 4.0   Lymphocyte # 1.8   Monocyte # 0.6   Eosinophil # 0.2   Basophil # 0.0 (Result(s) reported on 06 Dec 2012 at 03:56AM.)   Electronic  Signatures: Anabel Bene (MD)  (Signed 17-Dec-13 11:08)  Authored: REFERRING PHYSICIAN, Primary Care Physician, Consult, History of Present Illness, Review of Systems, PAST MEDICAL/SURGICAL HISTORY, HOME MEDICATIONS, Current Medications, ALLERGIES, NURSING VITAL SIGNS, LAB RESULTS   Last Updated: 17-Dec-13 11:08 by Anabel Bene (MD)

## 2015-04-10 NOTE — H&P (Signed)
PATIENT NAME:  [name], Raymond SandiferJAMES E MR#:  [id number] DATE OF BIRTH:  1933-08-18  DATE OF ADMISSION:  12/03/2012  CHIEF COMPLAINT: Altered mental status.   HISTORY OF PRESENT ILLNESS: The patient is a 81 year old male with history of COPD, coronary artery disease, diabetes, hypertension, peripheral vascular disease, chronic kidney disease, and sleep apnea. He was seen in the office on 11/30/2012 after his wife reported that for the last couple of days he appeared more confused, that he had been lethargic. Originally when we saw him he was brought in in a wheelchair. She reports he wasn't walking as well. He underwent CT of the head which revealed chronic changes. He underwent extensive lab evaluation which revealed normal electrolytes, normal white count, normal thyroid and C-reactive protein, minimal elevation of the sedimentation rate at 31. His urinalysis was also fairly bland. He was placed empirically on Levaquin with anticipation for him to return to see us in the next 24 to 48 hours. He came back in the office for follow-up today and he has continued to be lethargic. He may be walking a little bit better but has been more unsteady and remains quite confused. On evaluation in the office, he was noted to be tremulous at times and his wife reports that he has been more shaky in general. The patient answers questions appropriately, though his mental status is such that he has very poor short-term memory at baseline. She reports that he has been crying periodically, though the patient can't really tell me why. He denies specific pain or focal findings and reports he feels generally bad. He appears fatigued. He is breathing fairly rapidly.   PAST MEDICAL HISTORY:  1. Chronic obstructive pulmonary disease.  2. Coronary artery disease.  3. Diabetes mellitus.  4. Hyperlipidemia.  5. Hypertension with mild right renal artery stenosis by previous angiography.  6. Thrombocytopenia in the past.  7. Left carotid  artery stenosis with medical management recommended.  8. Benign prostatic hypertrophy.  9. History of renal stones.  10. Degenerative disk disease.  11. Diverticulosis.  12. Chronic microscopic hematuria.  13. Chronic kidney disease, stage III.  14. Anemia.  15. Sleep apnea.  16. Depression and anxiety.  17. Dementia with prior Neurology consultation.   ALLERGIES: Atorvastatin causes myalgias.   MEDICATIONS:  1. Advair 250/50 one puff b.i.d.   2. Aspirin 81 mg p.o. daily.  3. Citalopram 20 mg p.o. daily.  4. Lovastatin 40 mg p.o. at bedtime.  5. Toprol-XL 25 mg p.o. daily.  6. Albuterol MDI 2 puffs 4 times a day as needed.  7. Enalapril 20 mg p.o. b.i.d.  8. PreserVision 1 p.o. daily.  9. Lovaza 1 gram p.o. t.i.d.  10. Amlodipine 5 mg p.o. daily.  11. Namenda 10 mg p.o. b.i.d.  12. Meloxicam 15 mg p.o. daily p.r.n. arthritis.  13. Glipizide XL 5 mg p.o. daily.  14. Recently finished course of Levaquin.   SOCIAL HISTORY: Tobacco abuse, stopped in 2006. Rare alcohol.   FAMILY HISTORY: Coronary artery disease and colon cancer.   REVIEW OF SYSTEMS: Please see history of present illness. Has felt chilled but there have been no fevers. No neck stiffness. Denies chest pain. Feels short of breath chronically. No change in bowels, bladder. The remainder of complete review of systems from the wife is negative with limited information accuracy from the patient secondary to dementia.   PHYSICAL EXAMINATION:   VITAL SIGNS: Blood pressure 136/70, temperature 97.4, pulse 62, respirations 12, saturation 98% on room air.  GENERAL: Pleasant male, appears fatigued and confused.   EYES: Pupils round and reactive to light. Lids and conjunctivae unremarkable.   EARS, NOSE, AND THROAT: External examination unremarkable. Oropharynx is slightly dry without lesions.   NECK: Supple. Trachea midline. No thyromegaly.   CARDIOVASCULAR: Regular rate and rhythm without gallops or rubs. Carotid and  radial pulses 2+.   LUNGS: Decreased air flow into the bases without wheeze or retractions.   ABDOMEN: Soft, nontender, nondistended. Positive bowel sounds. No guard or rebound.   SKIN: Significant rashes or nodules.   LYMPH NODES: No cervical or supraclavicular nodes.   MUSCULOSKELETAL: No clubbing, cyanosis, edema.   NEUROLOGIC: Poor short-term memory as noted above. Tremulousness with hands outstretched. Mild pass pointing with finger-nose-finger bilaterally. Wide-based gait, very slow in turning. Positive Romberg. Motor strength appears to be equal.   LABORATORY, DIAGNOSTIC, AND RADIOLOGICAL DATA: CT of head 12/10 with no acute abnormalities.   Urinalysis 12/10 showing small leukocyte esterase, 1 to 3 white cells, otherwise negative. TSH 3.16. C-reactive protein 1. Sedimentation rate 31. White count 6.8, hemoglobin 14.2, platelets 165. Liver enzymes unremarkable. Creatinine 1.6.   IMPRESSION AND PLAN:  1. Altered mental status. With his altered mental status, his tremulousness, and mood lability, I am concerned that he could have potentially had frontal stroke. There is a report that he had metal in the orbit previously and could not have MRI so will get orbit films and if these are negative would proceed with MRI. Possibility of serotonin syndrome, though no fevers that were detected. Will hold his citalopram for now. I will ask Neurology and Psychiatry to see the patient for their opinions. Hold on any further antibiotics as there is no clear source to treat. Will send B12, a repeat sedimentation rate, and thyroid. Send RPR as well looking for other reversible source of delirium and dementia.  2. Chronic obstructive pulmonary disease. ABG to be obtained. Continue his home inhalers.  3. Hypertension. Adequate control with his current regimen. Will continue this.  4. Coronary artery disease. Continue aspirin. Continue lovastatin for now unless CPK elevated.  5. Sleep apnea. Would ask him  to use home CPAP. I do not believe he has been compliant with this previously.   DISCHARGE PLANNING: Will ask Care Management to see the patient.   ____________________________ Lynnea Ferrier, MD bjk:drc D: 12/03/2012 12:36:57 ET T: 12/03/2012 12:56:07 ET JOB#: [id number]  cc: Lynnea Ferrier, MD, <Dictator> Daniel Nones MD ELECTRONICALLY SIGNED 12/07/2012 13:03

## 2015-04-10 NOTE — Consult Note (Signed)
PATIENT NAME:  Garrett, Raymond TORIS LAVERDIERE MR#:  [id number] DATE OF BIRTH:  1933-09-06  DATE OF CONSULTATION:  12/03/2012  CONSULTING PHYSICIAN:  Audery Amel, MD  IDENTIFYING INFORMATION AND REASON FOR CONSULT: This is a 81 year old man admitted to the hospital because of complaints of recent mental status changes. Consultation is for any assistance in diagnosis and treatment.   HISTORY OF PRESENT ILLNESS: Information is obtained from the patient and from the chart. The admission note indicates that this patient, who is regular outpatient of Dr. Odessa Fleming, was seen in the office a few days ago and at that time his wife was reporting that he had been more confused and lethargic. It is said in the notes that that was for a couple of days. The patient was noted to be more lethargic, somewhat unsteady in his walk, and to appear somewhat more confused. There was also a report that he had been crying intermittently and feeling bad in a nonspecific way. The patient tells me that his chief complaint is feeling fatigued. He says he has felt so tired that he does not ever want to get out of bed and does not feel like he can participate in normal activities. He tells me this has been going on for a long time and is unclear about the exact time course. He also tells me that he needs to find out "what is going on in my head," but he cannot be more clear than that. He denies that he is feeling consistently sad and depressed. He tells me that he is still able to take some mental interest in normal things in his life, although he has not been active in doing much of anything recently. He says that his appetite has been about the same. He admits that he has had occasional crying spells but cannot give me more detail about it. He adamantly denies when I asked him if he feels depressed. He also denies having anything specific that he feels nervous about it and denies typical panic attacks. He is not aware of any conscious new anxiety or  stress in his life. When I asked him if he had ever had any thoughts about wishing that he were dead, he said that he had had that thought a few times but denied that he had any suicidal ideation and would not be more specific about it. The patient tells me that he has mainly noticed the fatigue but also thinks that he might be a little bit more confused. He tells me that he is convinced that it is due to his prescription medication, but he cannot tell me what medication it would be. He bases this on the fact that a statin he was taking at one point in the past made him feel bad, and he felt much better after he stopped it. The wife is not currently available, and I did not get more specific history from her. It is not clear that any of his medications have been radically changed recently.   PAST PSYCHIATRIC HISTORY: Dr. Odessa Fleming note lists depression and anxiety as a previous diagnosis for the patient, and he is currently on citalopram 20 mg a day. The patient does not know anything specific about his medicine, does not know how long he has been taking the citalopram, cannot tell me why. He tells me he has never had psychiatric hospitalization and is not aware of ever seeing a mental health professional. He denies any history of suicide attempts.   SUBSTANCE  ABUSE HISTORY: He denies drinking, or abusing drugs, or having any significant ongoing problem with substance abuse at any time in the past.   SOCIAL HISTORY: The patient has been married for 48 years, lives with his wife. He used to be a Naval architecttruck driver and says he also did several other jobs in the past. He says his relationship with his wife is good.   PAST MEDICAL HISTORY: The patient has chronic obstructive pulmonary disease, sleep apnea, history of anemia, chronic kidney disease stage III, diverticulosis, degenerative disk disease, history of renal stones, benign prostatic hypertrophy, history of left carotid artery stenosis, thrombocytopenia,  hyperlipidemia, diabetes, coronary artery disease and chronic microscopic hematuria. It is also stated that he has a history of dementia with prior neurology consultation. At this point, I have not located that consultation.   REVIEW OF SYSTEMS: As noted above, he mainly complains of being fatigued. He denies feeling depressed. He denies being sad most of the time. He denies feeling hopeless. He does vaguely admit to having had occasional suicidal ideation but absolutely denies any thought of actually wanting to harm himself or wishing to die. He denies hallucinations.   MENTAL STATUS EXAM: Elderly man interviewed in his hospital room. He was awake and cooperative and pleasant during the interview. He made good eye contact. Psychomotor activity was normal. Speech was easy to understand, normal tone. Affect was euthymic and reactive. No tearfulness or agitation noted. Thoughts were scattered at times. He would get tangential from time to time, was easily redirected. He did not make any grossly bizarre statements. He denied any hallucinations. He denied any acute suicidal or homicidal ideation. He was aware that he was in the hospital but had no idea what year it was, thought it was Saturday, and that the month was August. [place], could not name the town. However, he could remember 3 out of 3 objects immediately and 2 out of 3 at 1 minute.   ASSESSMENT: The patient is a 81 year old man with multiple symptoms of mental status change, not entirely clear what the time course of it has been. I am not sure if it might correlate with any change in any medication. Differential diagnosis is wide. It is possible that this could represent a depression. It is also possible that it could represent a new stroke or progression of dementia. I do not think that there is much likelihood that he has serotonin syndrome. I really do not see any symptoms that would be typical of serotonin  syndrome. Currently, I do not think that he is acutely dangerous to himself in the hospital.  TREATMENT PLAN: Given of the uncertainty of the diagnosis, I think it is better not to start any new medication. I see that Dr. Graciela HusbandsKlein has already discontinued the citalopram. I would not make an immediate change to add that back or to add any other medication. If this is a depression, it should become more clear with time. I look forward to the neurologic consult. Hopefully, if this is a diagnosable problem it will declare itself eventually. In the meantime, I will be glad to follow him while he stays in the hospital.   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   SECONDARY DIAGNOSES:  AXIS I: Rule out depression.   AXIS II: Deferred.   AXIS III: Multiple medical problems, see above.   AXIS IV: Moderate. Ongoing stress just from burden  of illness.   AXIS V: Functioning at time of evaluation is 50.  ____________________________ Audery Amel, MD jtc:cbb D: 12/04/2012 00:17:57 ET T: 12/04/2012 11:32:23 ET JOB#: [id number]  cc: Audery Amel, MD, <Dictator> Audery Amel MD ELECTRONICALLY SIGNED 12/06/2012 10:00

## 2015-04-14 NOTE — Discharge Summary (Signed)
PATIENT NAME:  [name], Abner E MR#:  [id number] DATE OF BIRTH:  1933/06/03  DATE OF ADMISSION:  03/31/2014 DATE OF DISCHARGE:  04/04/2014  FINAL DIAGNOSES:  1.  Acute renal failure secondary to gastroenteritis.  2.  Metabolic encephalopathy secondary to #1.  3.  Dementia.  4.  Chronic obstructive pulmonary disease.  5.  Coronary artery disease.  6.  Adult onset diabetes mellitus.  7.  Hyperlipidemia.  8.  Hypertension.  9.  History of peripheral vascular disease 10. Thrombocytopenia.  11. Benign prostatic hypertrophy.  12. Degenerative disk disease.  13. Chronic kidney disease, stage III.  14. Chronic anemia.  15. Depression with anxiety.   HISTORY AND PHYSICAL: Please see dictated admission history and physical.   HOSPITAL COURSE: The patient was admitted with confusion and evidence of acute renal failure and developed nausea, vomiting and diarrhea. It appeared most consistent with acute viral gastroenteritis and acute renal failure secondary to this due to hypovolemia and acute tubular necrosis. His symptoms resolved, and he was hydrated, with creatinine returning to normal. Blood pressure medications initially were held and then slowly reintroduced as his blood pressure climbed, he tolerated this well also. Physical therapy worked with the patient and he did very well and appears that he is able to go home well without additional supportive care with his wife helping supervise. At this time, he will be discharged home in stable condition with physical activity up as tolerated. He will follow a 2 gram sodium, 1800 calorie, ADA diet. He will check his sugar daily and record this. He will follow up in our office within the next 1 to 2 weeks.   DISCHARGE MEDICATIONS:  1.  Aspirin 81 mg p.o. daily.  2.  PreserVision 1 p.o. b.i.d.  3.  Multivitamin 1 p.o. daily. 4.  Lovastatin 40 mg p.o. at bedtime. 5.  Amlodipine 10 mg p.o. daily. 6.  Glipizide XL 5 mg p.o. in the morning with breakfast.   7.  Toprol-XL 25 mg p.o. daily.  8.  Donepezil 5 mg p.o. at bedtime.  9.  Sertraline 50 mg p.o. daily.  10. Buspirone 15 mg 1/2 to 1 tablet p.o. b.i.d. p.r.n.  11. Omeprazole 20 mg p.o. daily.  12. Vitamin B12 1000 mcg 1/2 tablet p.o. daily.  13. Flonase 1 spray inhaled once a day. 14. Azelastine 2 sprays nasally once a day.  15. Losartan 50 mg p.o. daily, dose reduced.  16. He will hold meloxicam at this time and will readdress using this medication at his followup.  ____________________________ Lynnea FerrierBert J. Charletha Dalpe III, MD bjk:aw D: 04/04/2014 07:50:01 ET T: 04/04/2014 08:13:01 ET JOB#: [id number]  cc: Lynnea FerrierBert J. Alexica Schlossberg III, MD, <Dictator> Daniel NonesBERT Jadda Hunsucker MD ELECTRONICALLY SIGNED 04/11/2014 8:16

## 2015-04-14 NOTE — H&P (Signed)
PATIENT NAME:  [name], Raymond SandiferJAMES E MR#:  [id number] DATE OF BIRTH:  09/03/1933  DATE OF ADMISSION:  03/31/2014  CHIEF COMPLAINT: Weakness, nausea, vomiting, diarrhea.   HISTORY OF PRESENT ILLNESS: 81 year old who presents to the clinic today having reported nausea and vomiting develop on 03/29/2014 around midnight with vomiting and subsequent diarrhea. He had multiple episodes yesterday. He has not vomited since yesterday  and his diarrhea has improved, however, his overall energy level has continued to decline. He has been more confused today and has not been able to take in fluids very well. He has a history of COPD, CAD, diabetes, hypertension, and renal insufficiency with last creatinine 1.5. He denies any abdominal pain. He has been febrile. No bleeding per rectum. He has tried over-the-counter Pepto-Bismol but vomited this. He has not been able to take his normal medications today or yesterday.    PAST MEDICAL HISTORY:  1.  COPD.  2.  CAD.  3.  Diabetes mellitus.  4.  Hyperlipidemia.  5.  Hypertension.  6.  Renal artery stenosis.  7.  Thrombocytopenia with subsequent normal platelet counts.  8.  PVD with left carotid artery stenosis.  9.  BPH.  10. Renal stones.  11. Back injury with DDD.  12. Diverticulosis.  13. Microscopic hematuria.  14. Chronic renal insufficiency with creatinine 1.5.  15. Anemia.  16. Bilateral eyelid droop with deviated septum.  17. Sleep apnea.  18. Depression.  19. Dementia.   PAST SURGICAL HISTORY: Includes right lens replacement.   CURRENT MEDICATIONS:  1.  Donepezil 5 mg one a day.  2.  Sertraline 50 mg once a day.  3.  Buspirone 15 mg daily.  4.  Aspirin 81 mg daily.  5.  Lovastatin 40 mg at bedtime.  6.  Toprol-XL 50 mg 1/2 tablet once a day.  7.  PreserVision lutein eyedrops as directed.  8.  Meloxicam 15 mg daily as needed.  9.  Vitamin B12 500 mcg daily.  10. Amlodipine 10 mg daily.  11. Centrum Silver 1 tablet a day.  12. Prilosec 20 mg  once a day.  13. Pepcid 20 mg at bedtime.  14. Losartan 50 mg at once a day.  15. Glipizide 5 mg state.   ALLERGIES:  LIPITOR CAUSING MYALGIAS.    SOCIAL HISTORY: Has a long history of tobacco abuse. Stopped smoking in 2006. He has occasional alcohol.   FAMILY HISTORY: Father with CAD and metastatic colon cancer.   REVIEW OF SYSTEMS: As per history of present illness. He has had increasing confusion. He also denies chest pain, shortness of breath. He has no increasing lower extremity swelling. He denies any abdominal pain. He has been febrile and he denies any headache.   PHYSICAL EXAMINATION:  GENERAL: An elderly male appearing weak slumped in the chair, alert and oriented x 1. VITAL SIGNS:  Weight 182, blood pressure 110/60 sitting, 92/58 standing, temperature is 100.4, pulse 96, oxygen saturation 96% on room air.  HEENT: Head is normocephalic, atraumatic. EOMs are intact. Nonicteric. Sclerae noninjected.  Oropharynx is dry with mucus present and dentures in place. Generalized redness. Ear examination shows TMs are clear with normal landmarks and mild amount of wax bilaterally.   NECK: Supple. No adenopathy.  HEART: Regular rate and rhythm. Distant heart sounds.  LUNGS: Diminished bilaterally.  ABDOMEN: Positive bowel sounds with soft, nontender, and nondistended.  EXTREMITIES: No edema. Distal pulses are 2+ and symmetric.  SKIN: Warm, dry with vascular discolorations in the lower extremities present.  NEUROLOGICAL:  Once again shows alert and oriented x 1 without focal weakness.   ASSESSMENT AND PLAN:  1.  Nausea, vomiting, diarrhea. Evidence of dehydration by examination and worsening confusion to his baseline. Suspect a gastroenteritis and is having improvement in nausea and vomiting and diarrhea, but overall has become weaker. We will check his labs including renal function and blood cultures, urine culture and C. difficile. Start normal saline at 100 mL/h. Add potassium depending on  results. Place on liquid diet. Zofran 4 mg IV q. 8 hours p.r.n. Tylenol p.r.n. for fever.  2.  Chronic renal insufficiency. We will follow labs closely.  3.  CAD. Obtain EKG on admission.  4.  Hypertension. We will hold amlodipine and losartan for now. Continue Toprol.  5.  Diabetes. Hold glipizide for now.  6.  Hyperlipidemia. Hold lovastatin.   ____________________________ Marveen Reeks. Amiley Shishido, PA-C rjt:tc D: 03/31/2014 15:54:31 ET T: 03/31/2014 16:31:42 ET JOB#: [id number]  cc: Marveen Reeks. Dionisio David, <Dictator> Alisia Ferrari PA ELECTRONICALLY SIGNED 04/01/2014 7:13

## 2015-04-15 NOTE — Op Note (Signed)
PATIENT NAME:  [name] MR#:  [id number] DATE OF BIRTH:  [birth year]  DATE OF PROCEDURE:  02/24/2012  PREOPERATIVE DIAGNOSES:  1. Right renal artery stenosis.  2. Malignant hypertension.  3. Chronic renal insufficiency, stage III.  POSTOPERATIVE DIAGNOSES:  1. Right renal artery stenosis.  2. Malignant hypertension.  3. Chronic renal insufficiency, stage III.   PROCEDURES PERFORMED:  1. Abdominal aortogram.  2. Selective injection of right renal artery.  3. Selective injection left renal artery.   SURGEON: Levora Dredge, M.D.   SEDATION: Versed 2 mg plus fentanyl 50 mcg administered IV. Continuous ECG, pulse oximetry and cardiopulmonary monitoring was performed throughout the entire procedure by the interventional radiology nurse. Total sedation time was 45 minutes.   ACCESS: Left common femoral artery, 5 French sheath.   CONTRAST USED: Isovue 40 mL.   FLUORO TIME: Approximately three minutes.   INDICATIONS: [name] is a 81 year old gentleman who has demonstrated very poorly controlled hypertension and mild renal insufficiency. Duplex ultrasound has demonstrated hemodynamically significant right renal artery stenosis and he is therefore undergoing angiography with the potential for intervention. The risks and benefits were reviewed, all questions are answered, and the patient agrees to proceed.   DESCRIPTION OF PROCEDURE: The patient is taken to special procedures and placed in the supine position. After adequate sedation is achieved, both groins are prepped and draped in sterile fashion. Ultrasound is placed in a sterile sleeve. Ultrasound is utilized secondary to lack of appropriate landmarks and to avoid vascular injury. Under direct ultrasound visualization, the common femoral artery is identified. It is echolucent, homogeneous, and pulsatile indicating patency. Image is recorded for the permanent record. Under real-time visualization micropuncture needle is inserted into  the anterior wall of the common femoral artery, microwire followed by micro sheath, J-wire followed by a 5 French sheath and 5 French pigtail catheter. The pigtail catheter is positioned at the level of T12 and bolus injection of contrast is utilized to demonstrate the abdominal aorta, in the AP projection. After review of the images, it appears that the left renal artery is occluded in its midportion as there is nonfilling of the distal renal vasculature and there is no nephrogram on the left. On the right, there appears to be moderate but not critical stenosis of the renal artery in its midportion. Therefore, a pigtail catheter is exchanged for a C2 catheter and the C2 catheter is used to engage first the right renal artery and then after images are obtained the left renal artery. The catheter is then removed and LAO projection of the left groin is obtained and StarClose device is deployed successfully. There are no immediate complications.   INTERPRETATION: The abdominal aorta is opacified with a bolus injection of contrast. As noted above, there appears to be an abrupt cut off on the left renal artery. On the right renal artery, there appears to be some disease, but it does not appear to be critical. After selection of the right renal artery with a C2 catheter, there is a fairly smooth segment in the midportion of the right renal artery which appears to be approximately 30 to 40% stenotic. The origin is widely patent and this does not appear to be clinically significant. Nephrogram is normal. On the left, which is also easily engaged, there is wide patency, perhaps less than 20% ostial stenosis, and again nephrogram is normal.   SUMMARY: Moderate renal artery atherosclerotic changes noted. Intervention is not indicated at this time.  ____________________________ Renford Dills, MD  ggs:slb D: 02/24/2012 10:01:32 ET     T: 02/24/2012 12:20:41 ET       JOB#: [id number]  cc: Renford DillsGregory G. Amery Minasyan, MD,  <Dictator> Lynnea FerrierBert J. Klein III, MD Renford DillsGREGORY G Devyn Griffing MD ELECTRONICALLY SIGNED 02/27/2012 10:48

## 2015-05-25 ENCOUNTER — Ambulatory Visit: Payer: Medicare Other

## 2015-05-29 ENCOUNTER — Ambulatory Visit: Payer: Medicare Other

## 2015-06-05 ENCOUNTER — Ambulatory Visit (INDEPENDENT_AMBULATORY_CARE_PROVIDER_SITE_OTHER): Payer: Medicare Other | Admitting: Podiatry

## 2015-06-05 DIAGNOSIS — M79676 Pain in unspecified toe(s): Secondary | ICD-10-CM

## 2015-06-05 DIAGNOSIS — B351 Tinea unguium: Secondary | ICD-10-CM | POA: Diagnosis not present

## 2015-06-05 NOTE — Progress Notes (Signed)
°  Subjective: 81 y.o.-year-old male returns the office today for painful, elongated, thickened toenails which he is unable to trim himelf. Denies any redness or drainage around the nails. Denies any acute changes since last appointment and no new complaints today. Denies any systemic complaints such as fevers, chills, nausea, vomiting.   Objective: AAO 3, NAD DP/PT pulses palpable, CRT less than 3 seconds Protective sensation decreased with Simms Weinstein monofilament, Nails are hypertrophic, dystrophic, elongated, brittle, discolored 10. There is tenderness overlying the nails 1-5 bilaterally. There is no surrounding erythema or drainage along the nail sites. No open lesions or pre-ulcerative lesions are identified. There are digital deformities and HAV present.  No other areas of tenderness bilateral lower extremities. No overlying edema, erythema, increased warmth. No pain with calf compression, swelling, warmth, erythema.  Assessment: Patient presents with symptomatic onychomycosis  Plan: -Treatment options including alternatives, risks, complications were discussed -Nails sharply debrided 10 without complication/bleeding. -Patient is a pleasant new toe diabetic shoes. At this time the paperwork was completed for precertification.  -Discussed daily foot inspection. If there are any changes, to call the office immediately.  -Follow-up in 3 months or sooner if any problems are to arise. In the meantime, encouraged to call the office with any questions, concerns, changes symptoms.

## 2015-07-24 ENCOUNTER — Ambulatory Visit (INDEPENDENT_AMBULATORY_CARE_PROVIDER_SITE_OTHER): Payer: Medicare Other | Admitting: Podiatry

## 2015-07-24 ENCOUNTER — Encounter: Payer: Self-pay | Admitting: Podiatry

## 2015-07-24 DIAGNOSIS — E1169 Type 2 diabetes mellitus with other specified complication: Secondary | ICD-10-CM

## 2015-07-24 NOTE — Progress Notes (Signed)
Diabetic shoe measurement , he measures a 12 2E , [place] 581 orthofeet. Molds sent to [facility]. No new complaints today. He was seen by the medical assistant for this today. Offered to be seen by myself, however declined.

## 2015-07-31 ENCOUNTER — Emergency Department
Admission: EM | Admit: 2015-07-31 | Discharge: 2015-07-31 | Disposition: A | Discharge status: Home / Self Care | Payer: Medicare Other | Attending: Emergency Medicine | Admitting: Emergency Medicine

## 2015-07-31 ENCOUNTER — Encounter: Payer: Self-pay | Admitting: Emergency Medicine

## 2015-07-31 ENCOUNTER — Encounter: Payer: Self-pay | Admitting: *Deleted

## 2015-07-31 ENCOUNTER — Emergency Department: Payer: Medicare Other

## 2015-07-31 DIAGNOSIS — Z79899 Other long term (current) drug therapy: Secondary | ICD-10-CM | POA: Diagnosis not present

## 2015-07-31 DIAGNOSIS — I10 Essential (primary) hypertension: Secondary | ICD-10-CM | POA: Insufficient documentation

## 2015-07-31 DIAGNOSIS — Z7952 Long term (current) use of systemic steroids: Secondary | ICD-10-CM | POA: Diagnosis not present

## 2015-07-31 DIAGNOSIS — E119 Type 2 diabetes mellitus without complications: Secondary | ICD-10-CM | POA: Insufficient documentation

## 2015-07-31 DIAGNOSIS — F0280 Dementia in other diseases classified elsewhere without behavioral disturbance: Secondary | ICD-10-CM | POA: Diagnosis not present

## 2015-07-31 DIAGNOSIS — Z7982 Long term (current) use of aspirin: Secondary | ICD-10-CM | POA: Insufficient documentation

## 2015-07-31 DIAGNOSIS — J159 Unspecified bacterial pneumonia: Secondary | ICD-10-CM | POA: Insufficient documentation

## 2015-07-31 DIAGNOSIS — Z87891 Personal history of nicotine dependence: Secondary | ICD-10-CM | POA: Diagnosis not present

## 2015-07-31 DIAGNOSIS — J189 Pneumonia, unspecified organism: Secondary | ICD-10-CM

## 2015-07-31 DIAGNOSIS — G309 Alzheimer's disease, unspecified: Secondary | ICD-10-CM | POA: Insufficient documentation

## 2015-07-31 DIAGNOSIS — R112 Nausea with vomiting, unspecified: Secondary | ICD-10-CM | POA: Diagnosis present

## 2015-07-31 DIAGNOSIS — R531 Weakness: Secondary | ICD-10-CM | POA: Diagnosis present

## 2015-07-31 HISTORY — DX: Chronic obstructive pulmonary disease, unspecified: J44.9

## 2015-07-31 LAB — BASIC METABOLIC PANEL
ANION GAP: 12 (ref 5–15)
BUN: 23 mg/dL — ABNORMAL HIGH (ref 6–20)
CHLORIDE: 100 mmol/L — AB (ref 101–111)
CO2: 24 mmol/L (ref 22–32)
CREATININE: 1.34 mg/dL — AB (ref 0.61–1.24)
Calcium: 9.5 mg/dL (ref 8.9–10.3)
GFR calc non Af Amer: 49 mL/min — ABNORMAL LOW (ref >60)
GFR, EST AFRICAN AMERICAN: 56 mL/min — AB (ref >60)
Glucose, Bld: 119 mg/dL — ABNORMAL HIGH (ref 65–99)
Potassium: 4 mmol/L (ref 3.5–5.1)
SODIUM: 136 mmol/L (ref 135–145)

## 2015-07-31 LAB — URINALYSIS COMPLETE WITH MICROSCOPIC (ARMC ONLY)
BACTERIA UA: NONE SEEN
Bilirubin Urine: NEGATIVE
GLUCOSE, UA: NEGATIVE mg/dL
HGB URINE DIPSTICK: NEGATIVE
Ketones, ur: NEGATIVE mg/dL
Leukocytes, UA: NEGATIVE
Nitrite: NEGATIVE
PH: 6 (ref 5.0–8.0)
Protein, ur: NEGATIVE mg/dL
Specific Gravity, Urine: 1.015 (ref 1.005–1.030)

## 2015-07-31 LAB — CBC
HCT: 41.2 % (ref 40.0–52.0)
HCT: 39.9 % — ABNORMAL LOW (ref 40.0–52.0)
Hemoglobin: 14.3 g/dL (ref 13.0–18.0)
Hemoglobin: 13.5 g/dL (ref 13.0–18.0)
MCH: 32 pg (ref 26.0–34.0)
MCH: 31.3 pg (ref 26.0–34.0)
MCHC: 34.6 g/dL (ref 32.0–36.0)
MCHC: 33.8 g/dL (ref 32.0–36.0)
MCV: 92.5 fL (ref 80.0–100.0)
MCV: 92.5 fL (ref 80.0–100.0)
PLATELETS: 162 10*3/uL (ref 150–440)
PLATELETS: 163 10*3/uL (ref 150–440)
RBC: 4.45 MIL/uL (ref 4.40–5.90)
RBC: 4.32 MIL/uL — ABNORMAL LOW (ref 4.40–5.90)
RDW: 13.1 % (ref 11.5–14.5)
RDW: 13.3 % (ref 11.5–14.5)
WBC: 9.8 10*3/uL (ref 3.8–10.6)
WBC: 10.9 10*3/uL — AB (ref 3.8–10.6)

## 2015-07-31 LAB — COMPREHENSIVE METABOLIC PANEL
ALK PHOS: 70 U/L (ref 38–126)
ALT: 24 U/L (ref 17–63)
AST: 37 U/L (ref 15–41)
Albumin: 4.2 g/dL (ref 3.5–5.0)
Anion gap: 12 (ref 5–15)
BUN: 21 mg/dL — AB (ref 6–20)
CALCIUM: 8.7 mg/dL — AB (ref 8.9–10.3)
CO2: 22 mmol/L (ref 22–32)
CREATININE: 1.28 mg/dL — AB (ref 0.61–1.24)
Chloride: 97 mmol/L — ABNORMAL LOW (ref 101–111)
GFR, EST AFRICAN AMERICAN: 60 mL/min — AB (ref >60)
GFR, EST NON AFRICAN AMERICAN: 52 mL/min — AB (ref >60)
GLUCOSE: 129 mg/dL — AB (ref 65–99)
POTASSIUM: 3.6 mmol/L (ref 3.5–5.1)
SODIUM: 131 mmol/L — AB (ref 135–145)
Total Bilirubin: 0.9 mg/dL (ref 0.3–1.2)
Total Protein: 6.9 g/dL (ref 6.5–8.1)

## 2015-07-31 LAB — TROPONIN I: Troponin I: <0.03 ng/mL (ref <0.031)

## 2015-07-31 LAB — LIPASE, BLOOD: LIPASE: 29 U/L (ref 22–51)

## 2015-07-31 MED ORDER — LEVOFLOXACIN 750 MG PO TABS: 750.0000 mg | ORAL_TABLET | Freq: Every day | ORAL | Status: AC

## 2015-07-31 MED ORDER — ONDANSETRON 4 MG PO TBDP: ORAL_TABLET | ORAL | Status: DC

## 2015-07-31 MED ORDER — ONDANSETRON HCL 4 MG/2ML IJ SOLN
4.0000 mg | Freq: Once | INTRAMUSCULAR | Status: AC
  Administered 2015-07-31: 4 mg via INTRAVENOUS

## 2015-07-31 MED ORDER — ONDANSETRON HCL 4 MG/2ML IJ SOLN
INTRAMUSCULAR | Status: AC
Start: 2015-07-31 — End: 2015-07-31
  Administered 2015-07-31: 4 mg via INTRAVENOUS
  Filled 2015-07-31: qty 2

## 2015-07-31 MED ORDER — SODIUM CHLORIDE 0.9 % IV BOLUS (SEPSIS)
1000.0000 mL | Freq: Once | INTRAVENOUS | Status: AC
  Administered 2015-07-31: 1000 mL via INTRAVENOUS

## 2015-07-31 MED ORDER — LEVOFLOXACIN IN D5W 750 MG/150ML IV SOLN
750.0000 mg | Freq: Once | INTRAVENOUS | Status: AC
  Administered 2015-07-31: 750 mg via INTRAVENOUS
  Filled 2015-07-31: qty 150

## 2015-07-31 NOTE — ED Notes (Signed)
Pt to triage via wheelchair.  Pt has vomiting.  Pt was seen in the ER earlier today with similar sx.  States sx worse since this morning.  Pt also has had diarrhea.

## 2015-07-31 NOTE — ED Notes (Signed)
DR. Derrill Kay made aware of PVC's and vtach beats, will continue to monitor pt closely

## 2015-07-31 NOTE — ED Notes (Signed)
4 oz ginger ale and 2 packs of saltines given to patient for PO challenge.  Family remains at bedside.  Patient is AAOx3.  NAD.  No current complaints.  Continue to monitor.

## 2015-07-31 NOTE — Discharge Instructions (Signed)
Please follow up as recommended previously on your discharge instructions within the next 2 days.  Take the antibiotics as prescribed starting tomorrow and take the nausea medicine as needed and as prescribed.  Return to the emergency department with new or worsening symptoms that concern you.   Nausea and Vomiting Nausea is a sick feeling that often comes before throwing up (vomiting). Vomiting is a reflex where stomach contents come out of your mouth. Vomiting can cause severe loss of body fluids (dehydration). Children and elderly adults can become dehydrated quickly, especially if they also have diarrhea. Nausea and vomiting are symptoms of a condition or disease. It is important to find the cause of your symptoms. CAUSES   Direct irritation of the stomach lining. This irritation can result from increased acid production (gastroesophageal reflux disease), infection, food poisoning, taking certain medicines (such as nonsteroidal anti-inflammatory drugs), alcohol use, or tobacco use.  Signals from the brain.These signals could be caused by a headache, heat exposure, an inner ear disturbance, increased pressure in the brain from injury, infection, a tumor, or a concussion, pain, emotional stimulus, or metabolic problems.  An obstruction in the gastrointestinal tract (bowel obstruction).  Illnesses such as diabetes, hepatitis, gallbladder problems, appendicitis, kidney problems, cancer, sepsis, atypical symptoms of a heart attack, or eating disorders.  Medical treatments such as chemotherapy and radiation.  Receiving medicine that makes you sleep (general anesthetic) during surgery. DIAGNOSIS Your caregiver may ask for tests to be done if the problems do not improve after a few days. Tests may also be done if symptoms are severe or if the reason for the nausea and vomiting is not clear. Tests may include:  Urine tests.  Blood tests.  Stool tests.  Cultures (to look for evidence of  infection).  X-rays or other imaging studies. Test results can help your caregiver make decisions about treatment or the need for additional tests. TREATMENT You need to stay well hydrated. Drink frequently but in small amounts.You may wish to drink water, sports drinks, clear broth, or eat frozen ice pops or gelatin dessert to help stay hydrated.When you eat, eating slowly may help prevent nausea.There are also some antinausea medicines that may help prevent nausea. HOME CARE INSTRUCTIONS   Take all medicine as directed by your caregiver.  If you do not have an appetite, do not force yourself to eat. However, you must continue to drink fluids.  If you have an appetite, eat a normal diet unless your caregiver tells you differently.  Eat a variety of complex carbohydrates (rice, wheat, potatoes, bread), lean meats, yogurt, fruits, and vegetables.  Avoid high-fat foods because they are more difficult to digest.  Drink enough water and fluids to keep your urine clear or pale yellow.  If you are dehydrated, ask your caregiver for specific rehydration instructions. Signs of dehydration may include:  Severe thirst.  Dry lips and mouth.  Dizziness.  Dark urine.  Decreasing urine frequency and amount.  Confusion.  Rapid breathing or pulse. SEEK IMMEDIATE MEDICAL CARE IF:   You have blood or brown flecks (like coffee grounds) in your vomit.  You have black or bloody stools.  You have a severe headache or stiff neck.  You are confused.  You have severe abdominal pain.  You have chest pain or trouble breathing.  You do not urinate at least once every 8 hours.  You develop cold or clammy skin.  You continue to vomit for longer than 24 to 48 hours.  You have a  fever. MAKE SURE YOU:   Understand these instructions.  Will watch your condition.  Will get help right away if you are not doing well or get worse. Document Released: 12/08/2005 Document Revised: 03/01/2012  Document Reviewed: 05/07/2011 [facility] Patient Information 2015 [place], [place]. This information is not intended to replace advice given to you by your health care provider. Make sure you discuss any questions you have with your health care provider.  Pneumonia, Adult Pneumonia is an infection of the lungs. It may be caused by a germ (virus or bacteria). Some types of pneumonia can spread easily from person to person. This can happen when you cough or sneeze. HOME CARE  Only take medicine as told by your doctor.  Take your medicine (antibiotics) as told. Finish it even if you start to feel better.  Do not smoke.  You may use a vaporizer or humidifier in your room. This can help loosen thick spit (mucus).  Sleep so you are almost sitting up (semi-upright). This helps reduce coughing.  Rest. A shot (vaccine) can help prevent pneumonia. Shots are often advised for:  People over 54 years old.  Patients on chemotherapy.  People with long-term (chronic) lung problems.  People with immune system problems. GET HELP RIGHT AWAY IF:   You are getting worse.  You cannot control your cough, and you are losing sleep.  You cough up blood.  Your pain gets worse, even with medicine.  You have a fever.  Any of your problems are getting worse, not better.  You have shortness of breath or chest pain. MAKE SURE YOU:   Understand these instructions.  Will watch your condition.  Will get help right away if you are not doing well or get worse. Document Released: 05/26/2008 Document Revised: 03/01/2012 Document Reviewed: 02/28/2011 [facility] Patient Information 2015 [place], [place]. This information is not intended to replace advice given to you by your health care provider. Make sure you discuss any questions you have with your health care provider.

## 2015-07-31 NOTE — ED Provider Notes (Signed)
[facility] Emergency Department Provider Note  ____________________________________________  Time seen: Approximately 4:49 PM  I have reviewed the triage vital signs and the nursing notes.   HISTORY  Chief Complaint Emesis    HPI [name] is a 81 y.o. male with multiple chronic medical problems and a recent diagnosis of pneumoniawho reports presents after being seen in the emergency department about 12 hours ago.  This time he reports and with vomiting.  After he was evaluated in the emergency department he was discharged earlier today after being changed to Levaquin.  He was stable throughout his stay and the patient and his family preferred for him to go home.  After they left the emergency department they went [employer] and he later vomited his lunch.  He then had several more episodes of vomiting so they returned to the ED.  He currently has no complaints.  The vomiting was acute in onset and moderate.  He denies any increased difficulty breathing and denies chest pain at this time.   Past Medical History  Diagnosis Date  . 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 Hypertension   . Asthma   . Shortness of breath   . Diabetes mellitus   . Stroke   . Myocardial infarction   . OSA (obstructive sleep apnea)   . COPD (chronic obstructive pulmonary disease)     Patient Active Problem List   Diagnosis Date Noted  . COPD 06/06/2013  . Essential hypertension, benign 06/06/2013  . Depression 05/16/2011  . Memory loss 05/16/2011  . Poor circulation 05/16/2011    Past Surgical History  Procedure Laterality Date  . Eye surgery    . Coronary artery bypass graft    . Cardiac catheterization    . Nasal sinus surgery      Current Outpatient Rx  Name  Route  Sig  Dispense  Refill  . levofloxacin (LEVAQUIN) 750 MG tablet   Oral   Take 1 tablet (750 mg total) by mouth daily.   7 tablet   0   . albuterol (VENTOLIN HFA) 108 (90 BASE)  MCG/ACT inhaler   Inhalation   Inhale 2 puffs into the lungs every 6 (six) hours as needed for wheezing.         Marland Kitchen amoxicillin-clavulanate (AUGMENTIN) 500-125 MG per tablet   Oral   Take 1 tablet by mouth 2 (two) times daily.         Marland Kitchen aspirin EC 81 MG tablet   Oral   Take 81 mg by mouth at bedtime.          . busPIRone (BUSPAR) 15 MG tablet   Oral   Take 7.5 mg by mouth 2 (two) times daily as needed.         . citalopram (CELEXA) 20 MG tablet   Oral   Take 20 mg by mouth daily.         . cyanocobalamin 500 MCG tablet   Oral   Take 1,000 mcg by mouth daily.          Marland Kitchen donepezil (ARICEPT) 5 MG tablet   Oral   Take 5 mg by mouth daily.         . fluticasone (FLONASE) 50 MCG/ACT nasal spray   Nasal   Place 2 sprays into the nose as needed.          Marland Kitchen glipiZIDE (GLUCOTROL XL) 5 MG 24 hr tablet   Oral   Take 5  mg by mouth daily.         Marland Kitchen HYDROcodone-homatropine (HYCODAN) 5-1.5 MG/5ML syrup   Oral   Take 5 mLs by mouth every 6 (six) hours as needed.         Marland Kitchen losartan (COZAAR) 50 MG tablet   Oral   Take 50 mg by mouth daily.         Marland Kitchen lovastatin (MEVACOR) 40 MG tablet   Oral   Take 40 mg by mouth.           . meloxicam (MOBIC) 15 MG tablet   Oral   Take 15 mg by mouth daily.         . metoprolol succinate (TOPROL-XL) 50 MG 24 hr tablet   Oral   Take 25 mg by mouth daily. Take with or immediately following a meal.         . Multiple Vitamin (MULTIVITAMIN WITH MINERALS) TABS tablet   Oral   Take 1 tablet by mouth daily.         Marland Kitchen olmesartan (BENICAR) 40 MG tablet      One half daily         . omeprazole (PRILOSEC) 20 MG capsule   Oral   Take 20 mg by mouth daily.         .           . predniSONE (DELTASONE) 20 MG tablet   Oral   Take 20 mg by mouth daily with breakfast. Takes two tabs for 4 days, then 1 tab for 4 days         . sertraline (ZOLOFT) 50 MG tablet   Oral   Take 50 mg by mouth daily.            Allergies Lipitor  Family History  Problem Relation Age of Onset  . Heart disease Father     Social History History  Substance Use Topics  . Smoking status: Former Smoker -- 2.00 packs/day for 50 years    Types: Cigarettes    Quit date: 12/18/2005  . Smokeless tobacco: Never Used  . Alcohol Use: No    Review of Systems Constitutional: No fever/chills Eyes: No visual changes. ENT: No sore throat. Cardiovascular: Denies chest pain. Respiratory: Cough with no shortness of breath. Gastrointestinal: No abdominal pain.  Several episodes of vomiting.  No diarrhea.  No constipation. Genitourinary: Negative for dysuria. Musculoskeletal: Negative for back pain. Skin: Negative for rash. Neurological: Negative for headaches, focal weakness or numbness.  10-point ROS otherwise negative.  ____________________________________________   PHYSICAL EXAM:  VITAL SIGNS: ED Triage Vitals  Enc Vitals Group     BP 07/31/15 1544 162/65 mmHg     Pulse Rate 07/31/15 1544 61     Resp 07/31/15 1544 20     Temp 07/31/15 1544 97.9 F (36.6 C)     Temp Source 07/31/15 1544 Oral     SpO2 07/31/15 1544 99 %     Weight 07/31/15 1544 172 lb (78.019 kg)     Height 07/31/15 1544 6' (1.829 m)     Head Cir --      Peak Flow --      Pain Score 07/31/15 1616 5     Pain Loc --      Pain Edu? --      Excl. in GC? --     Constitutional: Alert and at his baseline per his family. Well appearing and in no acute distress. Eyes: Conjunctivae are normal.  PERRL. EOMI. Head: Atraumatic. Nose: No congestion/rhinnorhea. Mouth/Throat: Mucous membranes are moist.  Oropharynx non-erythematous. Neck: No stridor.   Cardiovascular: Normal rate, regular rhythm. Grossly normal heart sounds.  Good peripheral circulation. Respiratory: Normal respiratory effort.  No retractions. Lungs CTAB.  Occasional cough.  No accessory muscle usage. Gastrointestinal: Soft and nontender. No distention. No abdominal bruits.  No CVA tenderness. Musculoskeletal: No lower extremity tenderness nor edema.  No joint effusions. Neurologic:  Normal speech and language. No gross focal neurologic deficits are appreciated.  Patient clearly has some memory difficulties but does have a history of dementia. Skin:  Skin is warm, dry and intact. No rash noted.   ____________________________________________   LABS (all labs ordered are listed, but only abnormal results are displayed)  Labs Reviewed  COMPREHENSIVE METABOLIC PANEL - Abnormal; Notable for the following:    Sodium 131 (*)    Chloride 97 (*)    Glucose, Bld 129 (*)    BUN 21 (*)    Creatinine, Ser 1.28 (*)    Calcium 8.7 (*)    GFR calc non Af Amer 52 (*)    GFR calc Af Amer 60 (*)    All other components within normal limits  CBC - Abnormal; Notable for the following:    WBC 10.9 (*)    RBC 4.32 (*)    HCT 39.9 (*)    All other components within normal limits  URINALYSIS COMPLETEWITH MICROSCOPIC ([facility] ONLY) - Abnormal; Notable for the following:    Color, Urine YELLOW (*)    APPearance CLEAR (*)    Squamous Epithelial / LPF 0-5 (*)    All other components within normal limits  LIPASE, BLOOD   ____________________________________________  EKG  ED ECG REPORT I, Ryon Layton, the attending physician, personally viewed and interpreted this ECG.   Date: 07/31/2015  EKG Time: 16:12  Rate: 59  Rhythm: sinus bradycardia  Axis: Normal  Intervals:Occasional PVC and PAC  ST&T Change: Non-specific ST segment / T-wave changes, but no evidence of acute ischemia.   ____________________________________________  RADIOLOGY  Dg Chest Portable 1 View  07/31/2015   CLINICAL DATA:  81 year old male with recent diagnosis of pneumonia. Week and feeling 'out of it' today  EXAM: PORTABLE CHEST - 1 VIEW  COMPARISON:  Prior chest x-ray 05/30/2013  FINDINGS: Cardiac and mediastinal contours remain unchanged. Atherosclerotic calcification present in the transverse  aorta. Slightly increased patchy airspace opacity in the right upper lung compared to 05/31/2019. Background chronic bronchitic change and interstitial prominence is otherwise similar. No pulmonary edema, pleural effusion or pneumothorax. No acute osseous abnormality.  IMPRESSION: 1. Increased patchy airspace opacity in the right lung apex could represent pneumonia in the appropriate clinical setting. Alternately, progression of pleural parenchymal scarring is a possibility. Followup PA and lateral chest X-ray is recommended in 3-4 weeks following trial of antibiotic therapy to ensure resolution and exclude underlying malignancy. 2. Aortic atherosclerosis. 3. Background of chronic bronchitic change and interstitial prominence appear similar compared to prior.   Electronically Signed   By: Malachy Moan M.D.   On: 07/31/2015 07:51    ____________________________________________   PROCEDURES  Procedure(s) performed: None  Critical Care performed: No ____________________________________________   INITIAL IMPRESSION / ASSESSMENT AND PLAN / ED COURSE  Pertinent labs & imaging results that were available during my care of the patient were reviewed by me and considered in my medical decision making (see chart for details).  The patient is well-appearing.  I discussed with the family that he  is likely vomiting as a result of his pneumonia but that his abdominal exam is reassuring.  He had nausea previously but just did not vomited, and it is likely that the [employer] was a bit too much for him.  I will treat him with Zofran and then by mouth challenge and reassess.  His family is agreeable to this plan.  ----------------------------------------- 6:43 PM on 07/31/2015 -----------------------------------------  Patient feels much better and tolerated the by mouth challenge without any issues.  I discussed with him and his family and they are ready to go home.  I gave my usual customary return  precautions.  ____________________________________________  FINAL CLINICAL IMPRESSION(S) / ED DIAGNOSES  Final diagnoses:  Community acquired pneumonia  Non-intractable vomiting with nausea, vomiting of unspecified type      NEW MEDICATIONS STARTED DURING THIS VISIT:  Discharge Medication List as of 07/31/2015  6:49 PM    START taking these medications   Details  ondansetron (ZOFRAN ODT) 4 MG disintegrating tablet Allow 1-2 tablets to dissolve in your mouth every 8 hours as needed for nausea/vomiting, Print         Loleta Rose, MD 07/31/15 2226

## 2015-07-31 NOTE — ED Provider Notes (Signed)
[facility] Emergency Department Provider Note  ____________________________________________  Time seen: Approximately 7:51 AM  I have reviewed the triage vital signs and the nursing notes.   HISTORY  Chief Complaint Weakness    HPI AMRON GUERRETTE is a 81 y.o. male who was diagnosed with pneumonia on Friday. The patient was prescribed antibiotics and prednisone. The patient is here tonight because he has been feeling worse. Per his wife he has been trembling with chills and nausea and has been feeling weak. She reports that he was even unable to stand at this evening. The patient has not had a fever but he's had a cough. The patient is taking Augmentin twice a day and may have had it for about 3 days. The patient's wife was told to call if he got worse but she reports that yesterday he was actually doing well. The patient reports he has had some chest pain with no vomiting. He also has had no significant shortness of breath blurred vision or headache. The patient's wife was concerned so she brought him into the hospital for further evaluation.   Past Medical History  Diagnosis Date  . 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 Hypertension   . Asthma   . Shortness of breath   . Diabetes mellitus   . Stroke   . Myocardial infarction   . OSA (obstructive sleep apnea)   . COPD (chronic obstructive pulmonary disease)     Patient Active Problem List   Diagnosis Date Noted  . COPD 06/06/2013  . Essential hypertension, benign 06/06/2013  . Depression 05/16/2011  . Memory loss 05/16/2011  . Poor circulation 05/16/2011    Past Surgical History  Procedure Laterality Date  . Eye surgery    . Coronary artery bypass graft    . Cardiac catheterization    . Nasal sinus surgery      Current Outpatient Rx  Name  Route  Sig  Dispense  Refill  . amoxicillin-clavulanate (AUGMENTIN) 500-125 MG per tablet   Oral   Take 1 tablet by mouth 2 (two) times  daily.         Marland Kitchen aspirin EC 81 MG tablet   Oral   Take 81 mg by mouth daily.         . busPIRone (BUSPAR) 15 MG tablet   Oral   Take 7.5 mg by mouth 2 (two) times daily as needed.         . cyanocobalamin 500 MCG tablet   Oral   Take 1,000 mcg by mouth daily.          Marland Kitchen donepezil (ARICEPT) 5 MG tablet   Oral   Take 5 mg by mouth daily.         Marland Kitchen glipiZIDE (GLUCOTROL XL) 5 MG 24 hr tablet   Oral   Take 5 mg by mouth daily.         Marland Kitchen losartan (COZAAR) 50 MG tablet   Oral   Take 50 mg by mouth daily.         Marland Kitchen lovastatin (MEVACOR) 40 MG tablet   Oral   Take 40 mg by mouth.           . meloxicam (MOBIC) 15 MG tablet   Oral   Take 15 mg by mouth daily.         . metoprolol succinate (TOPROL-XL) 50 MG 24 hr tablet   Oral   Take 25 mg by mouth daily.  Take with or immediately following a meal.         . Multiple Vitamins-Minerals (CENTRUM SILVER PO)   Oral   Take 1 tablet by mouth daily.         . Multiple Vitamins-Minerals (PRESERVISION AREDS PO)   Oral   Take 2 capsules by mouth daily.         Marland Kitchen omeprazole (PRILOSEC) 20 MG capsule   Oral   Take 20 mg by mouth daily.         . predniSONE (DELTASONE) 20 MG tablet   Oral   Take 20 mg by mouth daily with breakfast. Takes two tabs for 4 days, then 1 tab for 4 days         . sertraline (ZOLOFT) 50 MG tablet   Oral   Take 50 mg by mouth daily.         Marland Kitchen albuterol (VENTOLIN HFA) 108 (90 BASE) MCG/ACT inhaler   Inhalation   Inhale 2 puffs into the lungs every 6 (six) hours as needed for wheezing.         . fluticasone (FLONASE) 50 MCG/ACT nasal spray   Nasal   Place 2 sprays into the nose as needed.          Marland Kitchen olmesartan (BENICAR) 40 MG tablet      One half daily           Allergies Lipitor  Family History  Problem Relation Age of Onset  . Heart disease Father     Social History History  Substance Use Topics  . Smoking status: Former Smoker -- 2.00 packs/day for  50 years    Types: Cigarettes    Quit date: 12/18/2005  . Smokeless tobacco: Never Used  . Alcohol Use: No    Review of Systems Constitutional: No fever/chills Eyes: No visual changes. ENT: No sore throat. Cardiovascular: Denies chest pain. Respiratory: Cough with no shortness of breath. Gastrointestinal: nausea with No abdominal pain, no vomiting.  No diarrhea.  No constipation. Genitourinary: Negative for dysuria. Musculoskeletal: Negative for back pain. Skin: Negative for rash. Neurological: generalized weakness  10-point ROS otherwise negative.  ____________________________________________   PHYSICAL EXAM:  VITAL SIGNS: ED Triage Vitals  Enc Vitals Group     BP 07/31/15 0539 150/67 mmHg     Pulse Rate 07/31/15 0539 56     Resp 07/31/15 0539 24     Temp 07/31/15 0539 97.5 F (36.4 C)     Temp Source 07/31/15 0539 Oral     SpO2 07/31/15 0539 99 %     Weight 07/31/15 0539 172 lb (78.019 kg)     Height 07/31/15 0539 6' (1.829 m)     Head Cir --      Peak Flow --      Pain Score 07/31/15 0559 2     Pain Loc --      Pain Edu? --      Excl. in GC? --     Constitutional: Alert and oriented. Well appearing and moderate distress. Eyes: Conjunctivae are normal. PERRL. EOMI. Head: Atraumatic. Nose: No congestion/rhinnorhea. Mouth/Throat: Mucous membranes are moist.  Oropharynx non-erythematous. Cardiovascular: Normal rate, regular rhythm. Grossly normal heart sounds.  Good peripheral circulation. Respiratory: Normal respiratory effort.  No retractions. Lungs CTAB. Gastrointestinal: Soft and nontender. No distention. Positive bowel sounds Genitourinary: deferred Musculoskeletal: No lower extremity tenderness nor edema.   Neurologic:  Normal speech and language. No gross focal neurologic deficits are appreciated. No gait instability. Skin:  Skin is warm, dry and intact. No rash noted. Psychiatric: Mood and affect are normal.    ____________________________________________   LABS (all labs ordered are listed, but only abnormal results are displayed)  Labs Reviewed  BASIC METABOLIC PANEL - Abnormal; Notable for the following:    Chloride 100 (*)    Glucose, Bld 119 (*)    BUN 23 (*)    Creatinine, Ser 1.34 (*)    GFR calc non Af Amer 49 (*)    GFR calc Af Amer 56 (*)    All other components within normal limits  CULTURE, BLOOD (ROUTINE X 2)  CULTURE, BLOOD (ROUTINE X 2)  CBC  TROPONIN I   ____________________________________________  EKG  ED ECG REPORT I, Rebecka Apley, the attending physician, personally viewed and interpreted this ECG.   Date: 07/31/2015  EKG Time: 551  Rate: 52  Rhythm: sinus bradycardia  Axis: normal  Intervals:none  ST&T Change: normal  ____________________________________________  RADIOLOGY  CXR: Increased patchy airspace opacity in right lung apex cough represent pneumonia in appropriate clinical setting, Alternately progression of pleural parenchymal scarring. ____________________________________________   PROCEDURES  Procedure(s) performed: None  Critical Care performed: No  ____________________________________________   INITIAL IMPRESSION / ASSESSMENT AND PLAN / ED COURSE  Pertinent labs & imaging results that were available during my care of the patient were reviewed by me and considered in my medical decision making (see chart for details).  This is a 81 year old male who comes in today with a diagnosis of pneumonia and not feeling well although he has been on antibiotics. I will give the patient some antibiotics and he will be signed out to Dr Derrill Kay who will follow up the results of the blood work. ____________________________________________   FINAL CLINICAL IMPRESSION(S) / ED DIAGNOSES  Final diagnoses:  None      Rebecka Apley, MD 07/31/15 [phone]

## 2015-07-31 NOTE — ED Notes (Signed)
MD at bedside. 

## 2015-07-31 NOTE — ED Notes (Signed)
Tolerated PO Challenge well.  Patient denies Nausea, vomiting.

## 2015-07-31 NOTE — ED Notes (Signed)
Pt resting in bed with eyes closed, wife at bedside.

## 2015-07-31 NOTE — ED Notes (Signed)
AAOx3.  Skin warm and dry.  D/C home 

## 2015-07-31 NOTE — ED Notes (Signed)
Pt presents to ED via private auto with weakness and nausea since Friday. Worsened today. Pt was Friday with right lobe pneumonia. Has been taking his medications as prescribed but doesn't seem to be getting better.

## 2015-07-31 NOTE — Discharge Instructions (Signed)
Please seek medical attention for any high fevers, chest pain, shortness of breath, change in behavior, persistent vomiting, bloody stool or any other new or concerning symptoms. ° ° °Pneumonia °Pneumonia is an infection of the lungs.  °CAUSES °Pneumonia may be caused by bacteria or a virus. Usually, these infections are caused by breathing infectious particles into the lungs (respiratory tract). °SIGNS AND SYMPTOMS  °· Cough. °· Fever. °· Chest pain. °· Increased rate of breathing. °· Wheezing. °· Mucus production. °DIAGNOSIS  °If you have the common symptoms of pneumonia, your health care provider will typically confirm the diagnosis with a chest X-ray. The X-ray will show an abnormality in the lung (pulmonary infiltrate) if you have pneumonia. Other tests of your blood, urine, or sputum may be done to find the specific cause of your pneumonia. Your health care provider may also do tests (blood gases or pulse oximetry) to see how well your lungs are working. °TREATMENT  °Some forms of pneumonia may be spread to other people when you cough or sneeze. You may be asked to wear a mask before and during your exam. Pneumonia that is caused by bacteria is treated with antibiotic medicine. Pneumonia that is caused by the influenza virus may be treated with an antiviral medicine. Most other viral infections must run their course. These infections will not respond to antibiotics.  °HOME CARE INSTRUCTIONS  °· Cough suppressants may be used if you are losing too much rest. However, coughing protects you by clearing your lungs. You should avoid using cough suppressants if you can. °· Your health care provider may have prescribed medicine if he or she thinks your pneumonia is caused by bacteria or influenza. Finish your medicine even if you start to feel better. °· Your health care provider may also prescribe an expectorant. This loosens the mucus to be coughed up. °· Take medicines only as directed by your health care  provider. °· Do not smoke. Smoking is a common cause of bronchitis and can contribute to pneumonia. If you are a smoker and continue to smoke, your cough may last several weeks after your pneumonia has cleared. °· A cold steam vaporizer or humidifier in your room or home may help loosen mucus. °· Coughing is often worse at night. Sleeping in a semi-upright position in a recliner or using a couple pillows under your head will help with this. °· Get rest as you feel it is needed. Your body will usually let you know when you need to rest. °PREVENTION °A pneumococcal shot (vaccine) is available to prevent a common bacterial cause of pneumonia. This is usually suggested for: °· People over 65 years old. °· Patients on chemotherapy. °· People with chronic lung problems, such as bronchitis or emphysema. °· People with immune system problems. °If you are over 65 or have a high risk condition, you may receive the pneumococcal vaccine if you have not received it before. In some countries, a routine influenza vaccine is also recommended. This vaccine can help prevent some cases of pneumonia. You may be offered the influenza vaccine as part of your care. °If you smoke, it is time to quit. You may receive instructions on how to stop smoking. Your health care provider can provide medicines and counseling to help you quit. °SEEK MEDICAL CARE IF: °You have a fever. °SEEK IMMEDIATE MEDICAL CARE IF:  °· Your illness becomes worse. This is especially true if you are elderly or weakened from any other disease. °· You cannot control your cough with   suppressants and are losing sleep. °· You begin coughing up blood. °· You develop pain which is getting worse or is uncontrolled with medicines. °· Any of the symptoms which initially brought you in for treatment are getting worse rather than better. °· You develop shortness of breath or chest pain. °MAKE SURE YOU:  °· Understand these instructions. °· Will watch your condition. °· Will get  help right away if you are not doing well or get worse. °Document Released: 12/08/2005 Document Revised: 04/24/2014 Document Reviewed: 02/27/2011 °Exit[employer]® Patient Information ©2015 [employer]. This information is not intended to replace advice given to you by your health care provider. Make sure you discuss any questions you have with your health care provider. ° °

## 2015-07-31 NOTE — ED Provider Notes (Signed)
-----------------------------------------   10:48 AM on 07/31/2015 -----------------------------------------  Patient states that he does feel better. He would like to go home. He has received his IV antibiotics. Discussed discharge prescription with patient and family.  Phineas Semen, MD 07/31/15 1049

## 2015-08-05 LAB — CULTURE, BLOOD (ROUTINE X 2)
Culture: NO GROWTH
Culture: NO GROWTH

## 2015-08-15 ENCOUNTER — Other Ambulatory Visit: Payer: Self-pay | Admitting: Internal Medicine

## 2015-08-15 DIAGNOSIS — R1310 Dysphagia, unspecified: Secondary | ICD-10-CM

## 2015-08-16 ENCOUNTER — Ambulatory Visit: Payer: Medicare Other | Admitting: Podiatry

## 2015-08-30 ENCOUNTER — Ambulatory Visit
Admission: RE | Admit: 2015-08-30 | Discharge: 2015-08-30 | Disposition: A | Discharge status: Home / Self Care | Payer: Medicare Other | Source: Ambulatory Visit | Attending: Internal Medicine | Admitting: Internal Medicine

## 2015-08-30 DIAGNOSIS — R1310 Dysphagia, unspecified: Secondary | ICD-10-CM | POA: Diagnosis present

## 2015-08-30 DIAGNOSIS — R1313 Dysphagia, pharyngeal phase: Secondary | ICD-10-CM

## 2015-08-30 NOTE — Therapy (Signed)
[facility] DIAGNOSTIC RADIOLOGY [address] [place], [place], [address] Phone: [phone]   Fax:     Modified Barium Swallow  Patient Details  Name: [name] MRN: [id number] Date of Birth: August 15, 1933 Referring Provider:  Lynnea Ferrier, MD  Encounter Date: 08/30/2015      End of Session - 08/30/15 1355    Visit Number 1   Number of Visits 1   Date for SLP Re-Evaluation 08/30/15   SLP Start Time 1245   SLP Stop Time  1330   SLP Time Calculation (min) 45 min   Activity Tolerance Patient tolerated treatment well      Past Medical History  Diagnosis Date  . 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 Hypertension   . Asthma   . Shortness of breath   . Diabetes mellitus   . Stroke   . Myocardial infarction   . OSA (obstructive sleep apnea)   . COPD (chronic obstructive pulmonary disease)     Past Surgical History  Procedure Laterality Date  . Eye surgery    . Coronary artery bypass graft    . Cardiac catheterization    . Nasal sinus surgery      There were no vitals filed for this visit.  Visit Diagnosis: Pharyngeal dysphagia  Dysphagia - Plan: DG OP Swallowing Func-Medicare/Speech Path, DG OP Swallowing Func-Medicare/Speech Path      Subjective: Patient behavior: The patient is alert and oriented to swallowing.  He is a little confused, but very pleasant.  Chief complaint: the patient does not express specific complaints but did have a recent ED visit (07/31/2015) with diagnosis of community acquired pneumonia.   Objective:  Radiological Procedure: A videoflouroscopic evaluation of oral-preparatory, reflex initiation, and pharyngeal phases of the swallow was performed; as well as a screening of the upper esophageal phase.  I. POSTURE: Upright in MBS chair  II. VIEW: Lateral  III. COMPENSATORY STRATEGIES: Did not request specific maneuvers, but patient observed to use a spontaneous chin tuck that  appeared to aid air way protection.    IV. BOLUSES ADMINISTERED:   Thin Liquid: 4 cup rim sips   Nectar-thick Liquid: 1 cup rim sip    Puree: 2 teaspoons   Mechanical Soft: 1/4 graham cracker in applesauce  V. RESULTS OF EVALUATION: A. ORAL PREPARATORY PHASE: (The lips, tongue, and velum are observed for strength and coordination)  Within normal limits.  Oral control of the bolus including oral hold, rotary mastication, and anterior to posterior transfer is within normal limits.         **Overall Severity Rating: WNL  B. SWALLOW INITIATION/REFLEX: (The reflex is normal if "triggered" by the time the bolus reached the base of the tongue) Delayed; triggers while falling from the vallecular spaces to the pyriform sinuses.  **Overall Severity Rating: Mild  C. PHARYNGEAL PHASE: (Pharyngeal function is normal if the bolus shows rapid, smooth, and continuous transit through the pharynx and there is no pharyngeal residue after the swallow) Minimal-mild reduced tongue base retraction with minimal-to-mild vallecular residue.    **Overall Severity Rating: Minimal  D. LARYNGEAL PENETRATION: (Material entering into the laryngeal inlet/vestibule but not aspirated) 2 episodes of flash penetration without laryngeal vestibule residue (nectar-thick liquid and thin liquid)  E. ASPIRATION: Trace aspiration of thin liquid (1 of 4 trials)  F. ESOPHAGEAL PHASE: (Screening of the upper esophagus) N/A  ASSESSMENT: This 81 year old man, with Parkinson's disease, dementia, and recent community acquired  pneumonia, is presenting with mild oropharyngeal dysphagia characterized delayed pharyngeal swallow initiation and reduced tongue base retraction (resulting in min-too-mild vallecular residue).   The patient had one episode of trace aspiration (before the swallow) of thin liquids; this was one of four trials.  The patient is at risk for periodic aspiration.  As long as the patient can remain active, I would not  recommend changing his diet at this point. However, his pulmonary health should be monitored and if he becomes sedentary or begins developing chronic bronchitis or pneumonia another MBS, with speech follow-up, would be indicated.  PLAN/RECOMMENDATIONS:   A. Diet: continue usual diet   B. Swallowing Precautions: Standard   C. Recommended consultation to N/A   D. Therapy recommendations: reconsult SLP if there is an increase in overt clinical indicators of                          aspiration or onset of chronic bronchitis/pneumonia.    E. Results and recommendations were discussed briefly with the patient and his wife and the final                  report will be routed to referring MD.          G-Codes - 2015/09/12 1357    Functional Assessment Tool Used MBS   Functional Limitations Swallowing   Swallow Current Status (G9562) At least 20 percent but less than 40 percent impaired, limited or restricted   Swallow Goal Status (Z3086) At least 20 percent but less than 40 percent impaired, limited or restricted   Swallow Discharge Status [phone]) At least 20 percent but less than 40 percent impaired, limited or restricted          Problem List Patient Active Problem List   Diagnosis Date Noted  . COPD 06/06/2013  . Essential hypertension, benign 06/06/2013  . Depression 05/16/2011  . Memory loss 05/16/2011  . Poor circulation 05/16/2011   Dollene Primrose, MS/CCC- SLP  Leandrew Koyanagi September 12, 2015, 1:58 PM  [facility] DIAGNOSTIC RADIOLOGY [address] [place], [place], [address] Phone: [phone]   Fax:

## 2015-09-10 ENCOUNTER — Ambulatory Visit (INDEPENDENT_AMBULATORY_CARE_PROVIDER_SITE_OTHER): Payer: Medicare Other | Admitting: Podiatry

## 2015-09-10 DIAGNOSIS — M79676 Pain in unspecified toe(s): Secondary | ICD-10-CM

## 2015-09-10 DIAGNOSIS — E1169 Type 2 diabetes mellitus with other specified complication: Secondary | ICD-10-CM

## 2015-09-10 DIAGNOSIS — B351 Tinea unguium: Secondary | ICD-10-CM

## 2015-09-10 NOTE — Progress Notes (Signed)
  Subjective: 81 y.o.-year-old male returns the office today for painful, elongated, thickened toenails which he is unable to trim himelf. Denies any redness or drainage around the nails. Denies any acute changes since last appointment and no new complaints today. Denies any systemic complaints such as fevers, chills, nausea, vomiting.   Objective: AAO 3, NAD DP/PT pulses palpable, CRT less than 3 seconds Protective sensation decreased with Simms Weinstein monofilament, Nails are hypertrophic, dystrophic, elongated, brittle, discolored 10. There is tenderness overlying the nails 1-5 bilaterally. There is no surrounding erythema or drainage along the nail sites. No open lesions or pre-ulcerative lesions are identified. There are digital deformities and HAV present.  No other areas of tenderness bilateral lower extremities. No overlying edema, erythema, increased warmth. No pain with calf compression, swelling, warmth, erythema.  Assessment: Patient presents with symptomatic onychomycosis  Plan: -Treatment options including alternatives, risks, complications were discussed -Nails sharply debrided 10 without complication/bleeding. -Patient is a pleasant new toe diabetic shoes. At this time the paperwork was completed for precertification.  -Discussed daily foot inspection. If there are any changes, to call the office immediately.  -Follow-up in 3 months or sooner if any problems are to arise. In the meantime, encouraged to call the office with any questions, concerns, changes symptoms. -Diabetic shoes dispensed today with 3 pairs insoles. Fit appears good. 3 mo. For DM care.  [place] Ped DPM

## 2015-11-01 ENCOUNTER — Ambulatory Visit: Payer: Medicare Other | Admitting: *Deleted

## 2015-11-01 DIAGNOSIS — E1169 Type 2 diabetes mellitus with other specified complication: Secondary | ICD-10-CM

## 2015-11-01 NOTE — Patient Instructions (Signed)

## 2015-11-01 NOTE — Progress Notes (Signed)
Patient ID: Raymond RoeJames E Biehn, male   DOB: 12/31/1932, 81 y.o.   MRN: [id number] Patient presents for fitting [facility].  Written and verbal break in and wear instructions given.  Patient will keep future appointments as scheduled.

## 2015-11-20 DIAGNOSIS — F325 Major depressive disorder, single episode, in full remission: Secondary | ICD-10-CM | POA: Insufficient documentation

## 2015-12-10 ENCOUNTER — Ambulatory Visit (INDEPENDENT_AMBULATORY_CARE_PROVIDER_SITE_OTHER): Payer: Medicare Other | Admitting: Podiatry

## 2015-12-10 ENCOUNTER — Ambulatory Visit: Payer: Medicare Other

## 2015-12-10 ENCOUNTER — Encounter: Payer: Self-pay | Admitting: Podiatry

## 2015-12-10 DIAGNOSIS — E1169 Type 2 diabetes mellitus with other specified complication: Secondary | ICD-10-CM

## 2015-12-10 DIAGNOSIS — M79676 Pain in unspecified toe(s): Secondary | ICD-10-CM

## 2015-12-10 DIAGNOSIS — B351 Tinea unguium: Secondary | ICD-10-CM | POA: Diagnosis not present

## 2015-12-10 NOTE — Progress Notes (Signed)
He presents today chief complaint of painful elongated toenails bilateral.  Objective: Vital signs stable alert and oriented 3. Pulses are strongly palpable. Toenails are thick yellow dystrophic mycotic and painful palpation.  Assessment: Pain and limb secondary to onychomycosis 1 through 5 bilateral.  Plan: Debridement of nails 1 through 5 bilateral secondary to onychomycosis.

## 2015-12-19 ENCOUNTER — Emergency Department: Payer: Medicare Other

## 2015-12-19 ENCOUNTER — Emergency Department
Admission: EM | Admit: 2015-12-19 | Discharge: 2015-12-19 | Disposition: A | Discharge status: Home / Self Care | Payer: Medicare Other | Attending: Emergency Medicine | Admitting: Emergency Medicine

## 2015-12-19 DIAGNOSIS — B349 Viral infection, unspecified: Secondary | ICD-10-CM

## 2015-12-19 DIAGNOSIS — Z7982 Long term (current) use of aspirin: Secondary | ICD-10-CM | POA: Diagnosis not present

## 2015-12-19 DIAGNOSIS — F0280 Dementia in other diseases classified elsewhere without behavioral disturbance: Secondary | ICD-10-CM | POA: Diagnosis not present

## 2015-12-19 DIAGNOSIS — E119 Type 2 diabetes mellitus without complications: Secondary | ICD-10-CM | POA: Diagnosis not present

## 2015-12-19 DIAGNOSIS — Z79899 Other long term (current) drug therapy: Secondary | ICD-10-CM | POA: Insufficient documentation

## 2015-12-19 DIAGNOSIS — I10 Essential (primary) hypertension: Secondary | ICD-10-CM | POA: Diagnosis not present

## 2015-12-19 DIAGNOSIS — G300 Alzheimer's disease with early onset: Secondary | ICD-10-CM | POA: Diagnosis not present

## 2015-12-19 DIAGNOSIS — R112 Nausea with vomiting, unspecified: Secondary | ICD-10-CM

## 2015-12-19 DIAGNOSIS — Z87891 Personal history of nicotine dependence: Secondary | ICD-10-CM | POA: Diagnosis not present

## 2015-12-19 DIAGNOSIS — J449 Chronic obstructive pulmonary disease, unspecified: Secondary | ICD-10-CM | POA: Insufficient documentation

## 2015-12-19 LAB — COMPREHENSIVE METABOLIC PANEL
ALT: 21 U/L (ref 17–63)
ANION GAP: 8 (ref 5–15)
AST: 30 U/L (ref 15–41)
Albumin: 4.2 g/dL (ref 3.5–5.0)
Alkaline Phosphatase: 77 U/L (ref 38–126)
BILIRUBIN TOTAL: 1.2 mg/dL (ref 0.3–1.2)
BUN: 20 mg/dL (ref 6–20)
CO2: 26 mmol/L (ref 22–32)
Calcium: 9.4 mg/dL (ref 8.9–10.3)
Chloride: 106 mmol/L (ref 101–111)
Creatinine, Ser: 1.59 mg/dL — ABNORMAL HIGH (ref 0.61–1.24)
GFR calc Af Amer: 46 mL/min — ABNORMAL LOW (ref >60)
GFR, EST NON AFRICAN AMERICAN: 39 mL/min — AB (ref >60)
Glucose, Bld: 167 mg/dL — ABNORMAL HIGH (ref 65–99)
POTASSIUM: 4.5 mmol/L (ref 3.5–5.1)
Sodium: 140 mmol/L (ref 135–145)
TOTAL PROTEIN: 7.1 g/dL (ref 6.5–8.1)

## 2015-12-19 LAB — URINALYSIS COMPLETE WITH MICROSCOPIC (ARMC ONLY)
BACTERIA UA: NONE SEEN
Bilirubin Urine: NEGATIVE
GLUCOSE, UA: NEGATIVE mg/dL
Hgb urine dipstick: NEGATIVE
Ketones, ur: NEGATIVE mg/dL
Leukocytes, UA: NEGATIVE
NITRITE: NEGATIVE
Protein, ur: NEGATIVE mg/dL
SPECIFIC GRAVITY, URINE: 1.02 (ref 1.005–1.030)
pH: 5 (ref 5.0–8.0)

## 2015-12-19 LAB — CBC
HEMATOCRIT: 39 % — AB (ref 40.0–52.0)
Hemoglobin: 13.5 g/dL (ref 13.0–18.0)
MCH: 32.4 pg (ref 26.0–34.0)
MCHC: 34.8 g/dL (ref 32.0–36.0)
MCV: 93.2 fL (ref 80.0–100.0)
Platelets: 126 10*3/uL — ABNORMAL LOW (ref 150–440)
RBC: 4.18 MIL/uL — ABNORMAL LOW (ref 4.40–5.90)
RDW: 13.5 % (ref 11.5–14.5)
WBC: 10.2 10*3/uL (ref 3.8–10.6)

## 2015-12-19 LAB — TROPONIN I

## 2015-12-19 LAB — LIPASE, BLOOD: Lipase: 31 U/L (ref 11–51)

## 2015-12-19 MED ORDER — ONDANSETRON HCL 4 MG PO TABS: ORAL_TABLET | ORAL | Status: DC

## 2015-12-19 NOTE — ED Notes (Signed)
PO challenge initiated. Ginger ale and saltines given.

## 2015-12-19 NOTE — ED Provider Notes (Signed)
[facility] Emergency Department Provider Note  ____________________________________________  Time seen: Approximately 10:14 PM  I have reviewed the triage vital signs and the nursing notes.   HISTORY  Chief Complaint Cough; Emesis; and Weakness  History is limited by chronic dementia  HPI [name] is a 81 y.o. male with the history of dementia who presents in the company of his wife with acute onset today of vomiting and generalized weakness as well as a new cough that started last night. He is not had any specific shortness of breath and denies chest pain and abdominal pain as well. He did have multiple episodes during the day today in which he felt nauseated and apparently several episodes of vomiting. His wife reports that his temperature at home was elevated to at least 100 if not 101. However upon arrival to the emergency department his vital signs were stable and remains so during approximately 3 1/2 hours of observation.  The patient denies any current symptoms and states that he feels fine. His wife said that his cough has been occasionally productive of clear sputum. Overall his symptoms seem severe but now have resolved. At the time nothing made it better and nothing made it worse. His weakness was generalized and nonfocal and was occurring when he was feeling ill but has improved and is no longer present.   Past Medical History  Diagnosis Date  . 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 Hypertension   . Asthma   . Shortness of breath   . Diabetes mellitus   . Stroke (HCC)   . Myocardial infarction (HCC)   . OSA (obstructive sleep apnea)   . COPD (chronic obstructive pulmonary disease) [name](HCC)     Patient Active Problem List   Diagnosis Date Noted  . COPD 06/06/2013  . Essential hypertension, benign 06/06/2013  . Depression 05/16/2011  . Memory loss 05/16/2011  . Poor circulation 05/16/2011    Past Surgical History   Procedure Laterality Date  . Eye surgery    . Coronary artery bypass graft    . Cardiac catheterization    . Nasal sinus surgery      Current Outpatient Rx  Name  Route  Sig  Dispense  Refill  . aspirin EC 81 MG tablet   Oral   Take 81 mg by mouth at bedtime.          . busPIRone (BUSPAR) 15 MG tablet   Oral   Take 7.5 mg by mouth 2 (two) times daily as needed.         . citalopram (CELEXA) 20 MG tablet   Oral   Take 20 mg by mouth daily.         . cyanocobalamin 500 MCG tablet   Oral   Take 1,000 mcg by mouth daily.          [name] Kitchen. donepezil (ARICEPT) 5 MG tablet   Oral   Take 5 mg by mouth daily.         . fluticasone (FLONASE) 50 MCG/ACT nasal spray   Nasal   Place 2 sprays into the nose as needed.          [name] Kitchen. glipiZIDE (GLUCOTROL XL) 5 MG 24 hr tablet   Oral   Take 5 mg by mouth daily.         [name] Kitchen. losartan (COZAAR) 50 MG tablet   Oral   Take 50 mg by mouth daily.         .[name] Kitchen  lovastatin (MEVACOR) 40 MG tablet   Oral   Take 40 mg by mouth.           . metoprolol succinate (TOPROL-XL) 50 MG 24 hr tablet   Oral   Take 25 mg by mouth daily. Take with or immediately following a meal.         . Multiple Vitamin (MULTIVITAMIN WITH MINERALS) TABS tablet   Oral   Take 1 tablet by mouth daily.         [name] Kitchen omeprazole (PRILOSEC) 20 MG capsule   Oral   Take 20 mg by mouth daily.         . ondansetron (ZOFRAN ODT) 4 MG disintegrating tablet      Allow 1-2 tablets to dissolve in your mouth every 8 hours as needed for nausea/vomiting   30 tablet   0   . sertraline (ZOLOFT) 50 MG tablet   Oral   Take 50 mg by mouth daily.         [name] Kitchen olmesartan (BENICAR) 40 MG tablet      One half daily Patient not taking: Reported on 12/19/2015         . ondansetron (ZOFRAN) 4 MG tablet      Take 1-2 tabs by mouth every 8 hours as needed for nausea/vomiting   30 tablet   0     Allergies Lipitor  Family History  Problem Relation Age of Onset  .  Heart disease Father     Social History Social History  Substance Use Topics  . Smoking status: Former Smoker -- 2.00 packs/day for 50 years    Types: Cigarettes    Quit date: 12/18/2005  . Smokeless tobacco: Never Used  . Alcohol Use: No    Review of Systems Constitutional: reportedly had fever measured at home between 100 and 101.  Generalized weakness. Eyes: No visual changes. ENT: No sore throat. Cardiovascular: Denies chest pain. Respiratory: Denies shortness of breath. Cough productive of small amount of clear sputum Gastrointestinal: No abdominal pain.  Nausea with one or more episodes of vomiting.  No diarrhea.  No constipation. Genitourinary: Negative for dysuria. Musculoskeletal: Negative for back pain. Skin: Negative for rash. Neurological: Negative for headaches, focal weakness or numbness.  10-point ROS otherwise negative.  ____________________________________________   PHYSICAL EXAM:  VITAL SIGNS: ED Triage Vitals  Enc Vitals Group     BP 12/19/15 1923 125/58 mmHg     Pulse Rate 12/19/15 1923 76     Resp 12/19/15 1923 18     Temp 12/19/15 1923 98.6 F (37 C)     Temp Source 12/19/15 1923 Oral     SpO2 12/19/15 1923 93 %     Weight 12/19/15 1923 182 lb (82.555 kg)     Height 12/19/15 1923  (1.88 m)     Head Cir --      Peak Flow --      Pain Score --      Pain Loc --      Pain Edu? --      Excl. in GC? --     Constitutional: Alert and oriented to person and place. Well appearing and in no acute distress. Making jokes with me and interacting appropriately with his wife. Eyes: Conjunctivae are normal. PERRL. EOMI. Head: Atraumatic. Nose: No congestion/rhinnorhea. Mouth/Throat: Mucous membranes are moist.  Oropharynx non-erythematous. Neck: No stridor.  No meningismus Cardiovascular: Normal rate, regular rhythm. Grossly normal heart sounds.  Good peripheral circulation. Respiratory: Normal respiratory  effort.  No retractions. Lungs  CTAB. Gastrointestinal: Soft and nontender. No distention. No abdominal bruits. No CVA tenderness. Musculoskeletal: No lower extremity tenderness nor edema.  No joint effusions. Neurologic:  Normal speech and language. No gross focal neurologic deficits are appreciated.  Skin:  Skin is warm, dry and intact. No rash noted.  ____________________________________________   LABS (all labs ordered are listed, but only abnormal results are displayed)  Labs Reviewed  CBC - Abnormal; Notable for the following:    RBC 4.18 (*)    HCT 39.0 (*)    Platelets 126 (*)    All other components within normal limits  URINALYSIS COMPLETEWITH MICROSCOPIC ([facility] ONLY) - Abnormal; Notable for the following:    Color, Urine AMBER (*)    APPearance CLEAR (*)    Squamous Epithelial / LPF 0-5 (*)    All other components within normal limits  COMPREHENSIVE METABOLIC PANEL - Abnormal; Notable for the following:    Glucose, Bld 167 (*)    Creatinine, Ser 1.59 (*)    GFR calc non Af Amer 39 (*)    GFR calc Af Amer 46 (*)    All other components within normal limits  LIPASE, BLOOD  TROPONIN I   ____________________________________________  EKG  ED ECG REPORT I, Roshell Brigham, the attending physician, personally viewed and interpreted this ECG.  Date: 12/19/2015 EKG Time: 19:35 Rate: 64 Rhythm: normal sinus rhythm QRS Axis: normal Intervals: normal ST/T Wave abnormalities: normal Conduction Disutrbances: none Narrative Interpretation: Occasional PAC  ____________________________________________  RADIOLOGY   Dg Chest 2 View  12/19/2015  CLINICAL DATA:  Cough EXAM: CHEST - 2 VIEW COMPARISON:  07/31/2015 FINDINGS: Cardiac shadow is within normal limits. Aortic calcifications are again seen. The lungs are well aerated bilaterally. No focal infiltrate or sizable effusion is seen. No acute bony abnormality is noted. IMPRESSION: No acute abnormality noted. Electronically Signed   By: Alcide Clever M.D.    On: 12/19/2015 20:10    ____________________________________________   PROCEDURES  Procedure(s) performed: None  Critical Care performed: No ____________________________________________   INITIAL IMPRESSION / ASSESSMENT AND PLAN / ED COURSE  Pertinent labs & imaging results that were available during my care of the patient were reviewed by me and considered in my medical decision making (see chart for details).  The patient's age and comorbidities make his symptoms and history present illness concerning, his vital signs have been stable and he has been well appearing in asymptomatic during approximately 3 1/2 hours of observation in the emergency department. His wife and the patient are both anxious to go home. He tolerated a PO challenge in the ED without difficulty. On two separate exams he has had absolutely no abdominal tenderness and he has clear lung sounds and a completely reassuring workout.  I explained to the patient's wife that I do not know exactly what causes symptoms but he likely is suffering from a viral illness such as a viral bronchitis or perhaps a viral gastrointestinal illness. They explained to her that he should try to stay hydrated with clear fluids and follow up with the patient's primary care doctor tomorrow for the next available follow-up appointment. I gave them my usual and customary return precautions. The patient and his wife understand and agree and she assured me that they will return should his status decompensate.  ____________________________________________  FINAL CLINICAL IMPRESSION(S) / ED DIAGNOSES  Final diagnoses:  Nausea and vomiting, vomiting of unspecified type  Viral syndrome      NEW  MEDICATIONS STARTED DURING THIS VISIT:  Discharge Medication List as of 12/19/2015 11:15 PM    START taking these medications   Details  ondansetron (ZOFRAN) 4 MG tablet Take 1-2 tabs by mouth every 8 hours as needed for nausea/vomiting, Print          Loleta Rose, MD 12/20/15 [phone]

## 2015-12-19 NOTE — Discharge Instructions (Signed)
As we discussed, your workup today was reassuring.  Though we do not know exactly what is causing your symptoms, it appears that you have no emergent medical condition at this time are safe to go home and follow up as recommended in this paperwork.  Your symptoms are likely caused by a viral infection.    Please return immediately to the Emergency Department if you develop any new or worsening symptoms that concern you.   Nausea and Vomiting Nausea is a sick feeling that often comes before throwing up (vomiting). Vomiting is a reflex where stomach contents come out of your mouth. Vomiting can cause severe loss of body fluids (dehydration). Children and elderly adults can become dehydrated quickly, especially if they also have diarrhea. Nausea and vomiting are symptoms of a condition or disease. It is important to find the cause of your symptoms. CAUSES   Direct irritation of the stomach lining. This irritation can result from increased acid production (gastroesophageal reflux disease), infection, food poisoning, taking certain medicines (such as nonsteroidal anti-inflammatory drugs), alcohol use, or tobacco use.  Signals from the brain.These signals could be caused by a headache, heat exposure, an inner ear disturbance, increased pressure in the brain from injury, infection, a tumor, or a concussion, pain, emotional stimulus, or metabolic problems.  An obstruction in the gastrointestinal tract (bowel obstruction).  Illnesses such as diabetes, hepatitis, gallbladder problems, appendicitis, kidney problems, cancer, sepsis, atypical symptoms of a heart attack, or eating disorders.  Medical treatments such as chemotherapy and radiation.  Receiving medicine that makes you sleep (general anesthetic) during surgery. DIAGNOSIS Your caregiver may ask for tests to be done if the problems do not improve after a few days. Tests may also be done if symptoms are severe or if the reason for the nausea and  vomiting is not clear. Tests may include:  Urine tests.  Blood tests.  Stool tests.  Cultures (to look for evidence of infection).  X-rays or other imaging studies. Test results can help your caregiver make decisions about treatment or the need for additional tests. TREATMENT You need to stay well hydrated. Drink frequently but in small amounts.You may wish to drink water, sports drinks, clear broth, or eat frozen ice pops or gelatin dessert to help stay hydrated.When you eat, eating slowly may help prevent nausea.There are also some antinausea medicines that may help prevent nausea. HOME CARE INSTRUCTIONS   Take all medicine as directed by your caregiver.  If you do not have an appetite, do not force yourself to eat. However, you must continue to drink fluids.  If you have an appetite, eat a normal diet unless your caregiver tells you differently.  Eat a variety of complex carbohydrates (rice, wheat, potatoes, bread), lean meats, yogurt, fruits, and vegetables.  Avoid high-fat foods because they are more difficult to digest.  Drink enough water and fluids to keep your urine clear or pale yellow.  If you are dehydrated, ask your caregiver for specific rehydration instructions. Signs of dehydration may include:  Severe thirst.  Dry lips and mouth.  Dizziness.  Dark urine.  Decreasing urine frequency and amount.  Confusion.  Rapid breathing or pulse. SEEK IMMEDIATE MEDICAL CARE IF:   You have blood or brown flecks (like coffee grounds) in your vomit.  You have black or bloody stools.  You have a severe headache or stiff neck.  You are confused.  You have severe abdominal pain.  You have chest pain or trouble breathing.  You do not urinate  at least once every 8 hours.  You develop cold or clammy skin.  You continue to vomit for longer than 24 to 48 hours.  You have a fever. MAKE SURE YOU:   Understand these instructions.  Will watch your  condition.  Will get help right away if you are not doing well or get worse.   This information is not intended to replace advice given to you by your health care provider. Make sure you discuss any questions you have with your health care provider.   Document Released: 12/08/2005 Document Revised: 03/01/2012 Document Reviewed: 05/07/2011 Elsevier Interactive Patient Education [employer]

## 2015-12-19 NOTE — ED Notes (Addendum)
Pt to triage via w/c with no distress noted, accomp by wife who reports pt with cough since last night, V x1 with weakness; tylenol and zofran admin PTA; pt denies any pain; st temp 100-101 at home

## 2016-02-11 IMAGING — RF DG SWALLOWING FUNCTION
11 series · 13 of 24 positions shown · non-contrast
Comparison: None.

CLINICAL DATA: Dysphagia.

EXAM:
MODIFIED BARIUM SWALLOW
TECHNIQUE: Different consistencies of barium were administered orally to the
patient by the Speech Pathologist. Imaging of the pharynx was
performed in the lateral projection.
FLUOROSCOPY TIME:  Fluoroscopy Time:  2 minutes 0 seconds.
Number of Acquired Images:  Cine images.

[Series 2: run · 2 of 46 frames shown (1 of 11)]
[frame 7/46]
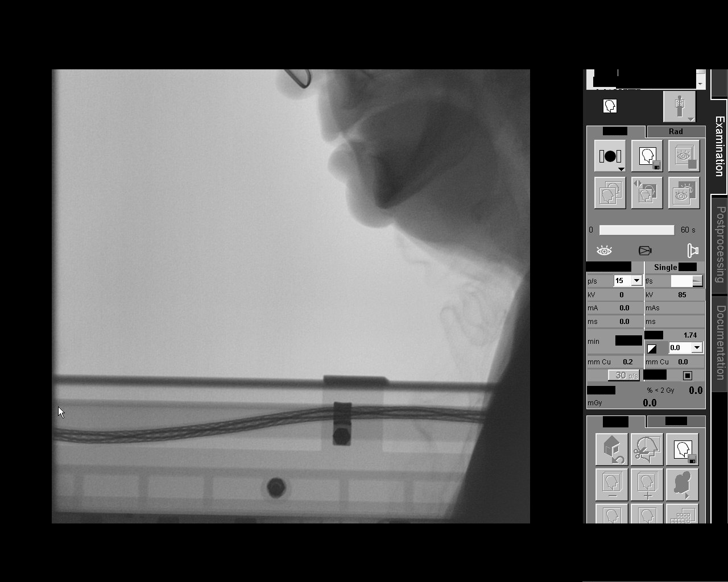
[frame 40/46]
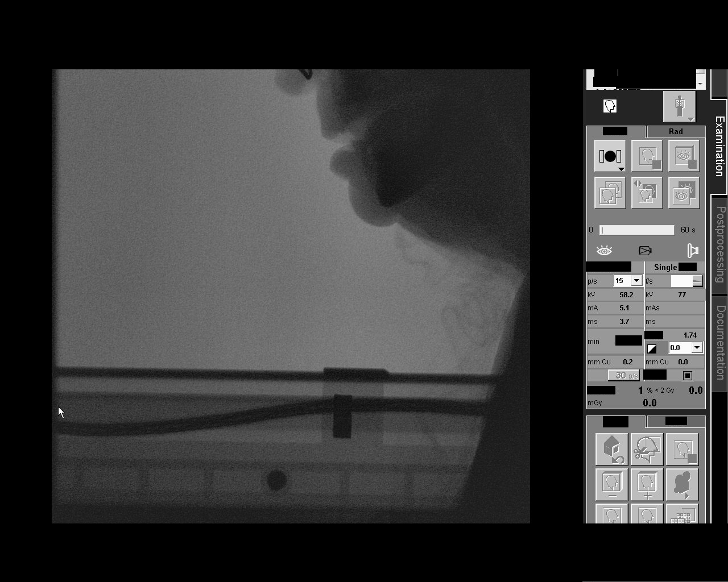

[Series 3: run · 1 of 178 frames shown (2 of 11)]
[frame 152/178]
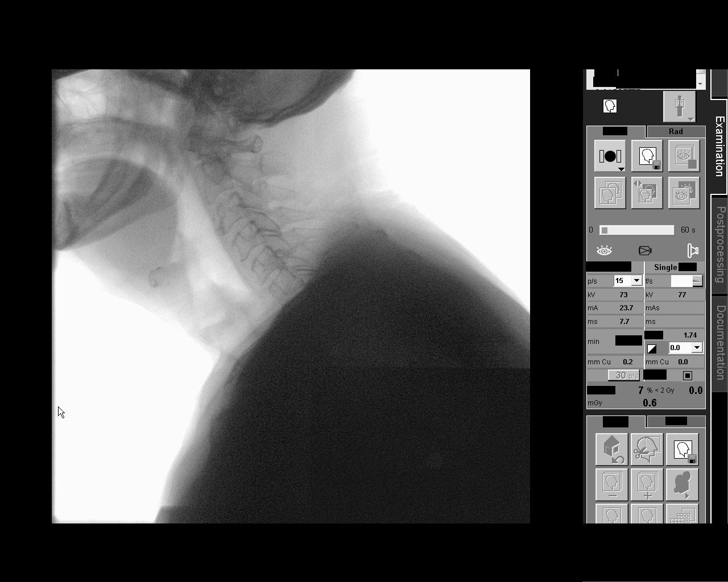

[Series 4: run · 1 of 413 frames shown (3 of 11)]
[frame 261/413]
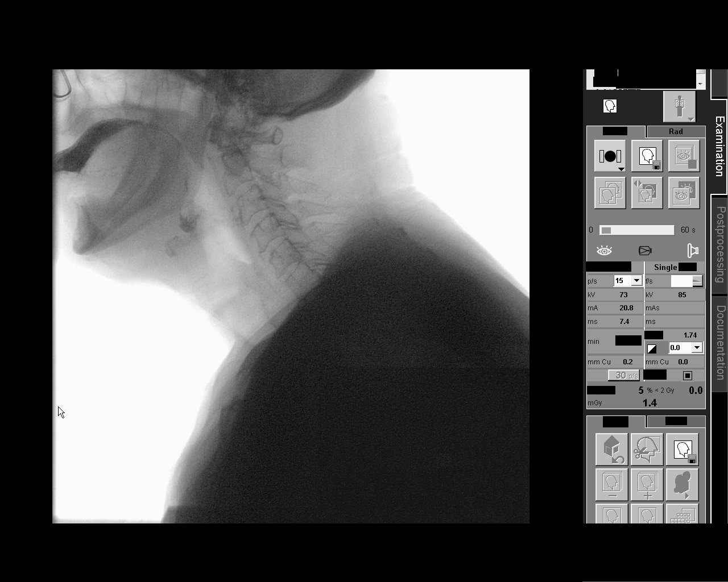

[Series 5: run · 1 of 38 frames shown (4 of 11)]
[frame 20/38]
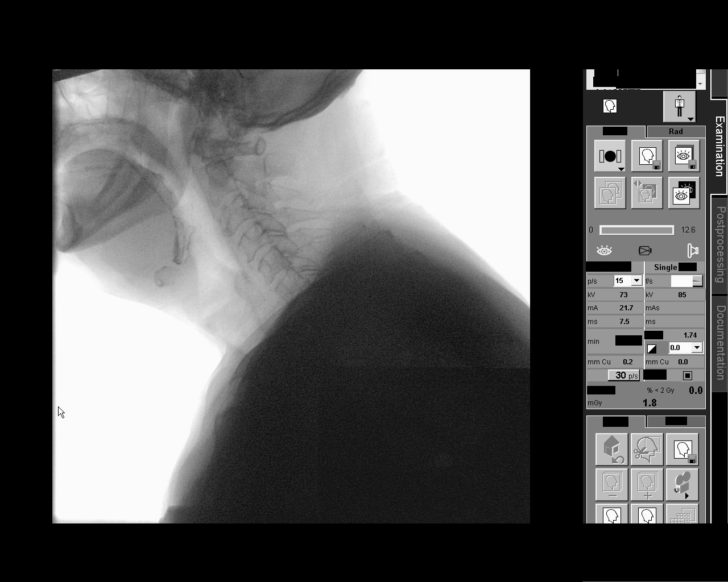

[Series 6: run · 1 of 343 frames shown (5 of 11)]
[frame 52/343]
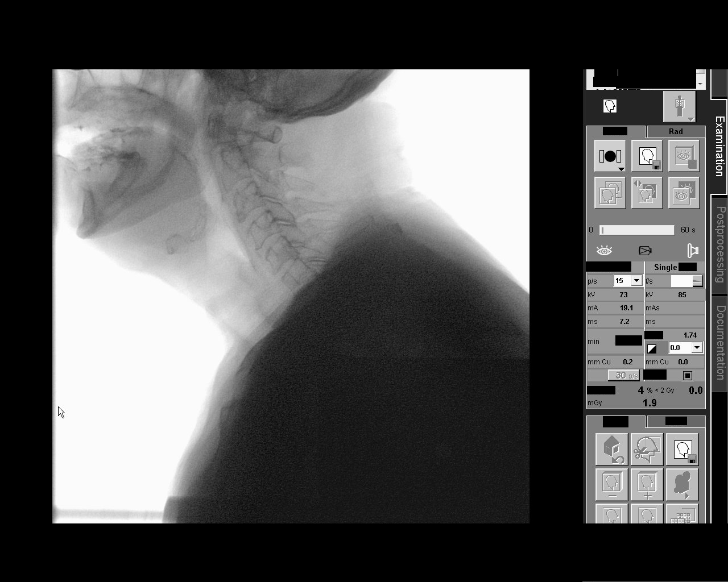

[Series 7: run · 1 of 1 slices shown (6 of 11)]
[im 1/1]
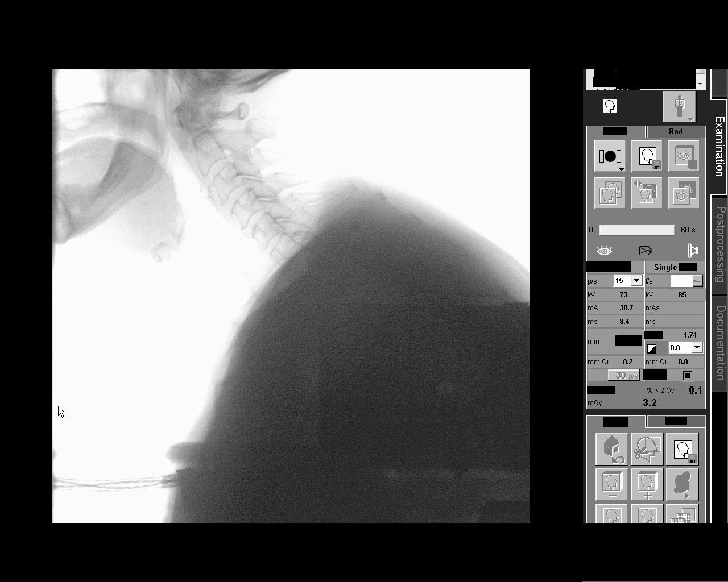

[Series 8: run · 1 of 218 frames shown (7 of 11)]
[frame 33/218]
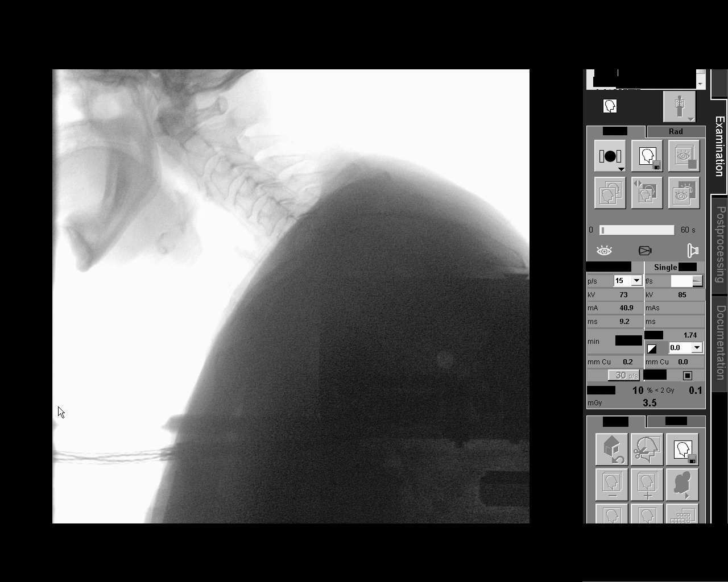

[Series 9: run · 1 of 369 frames shown (8 of 11)]
[frame 56/369]
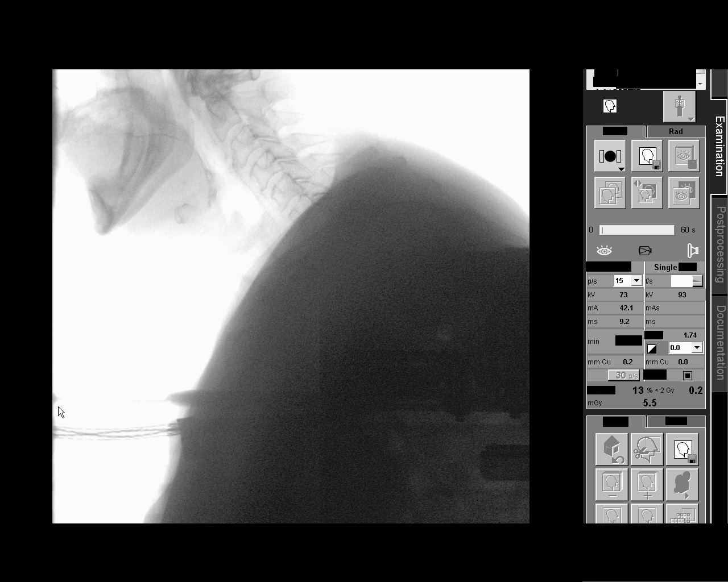

[Series 10: run · 1 of 15 frames shown (9 of 11)]
[frame 3/15]
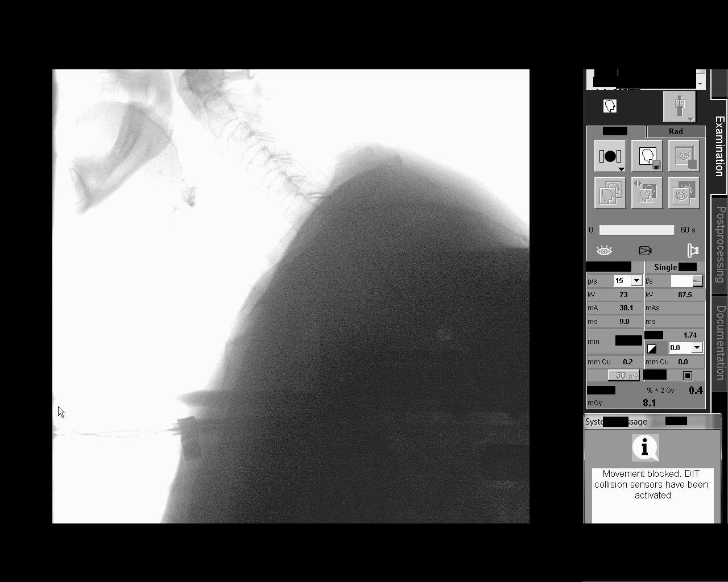

[Series 11: run · 1 of 520 frames shown (10 of 11)]
[frame 79/520]
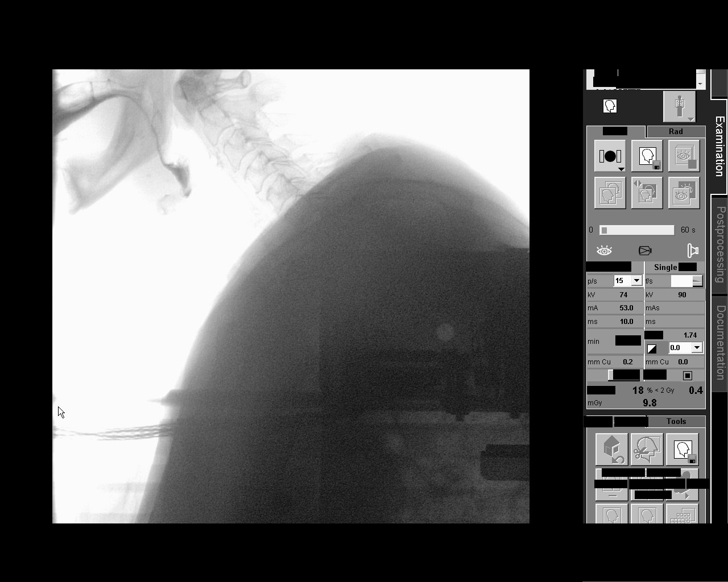

[Series 12: run · 2 of 832 frames shown (11 of 11)]
[frame 110/832]
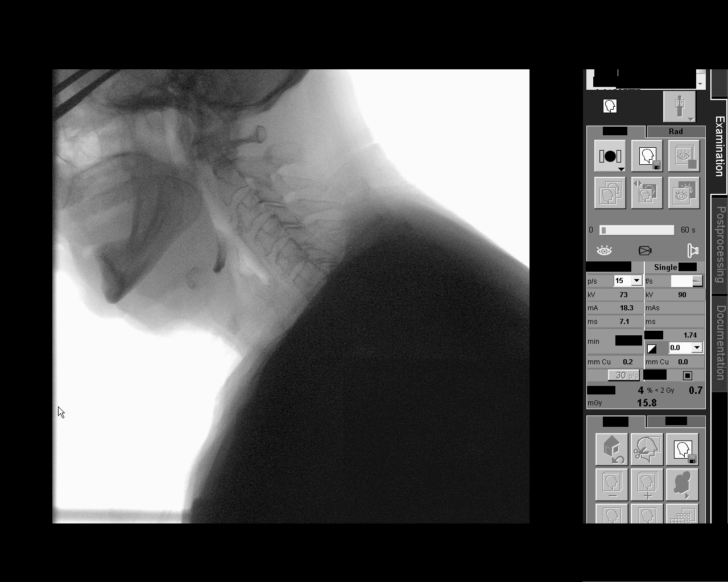
[frame 708/832]
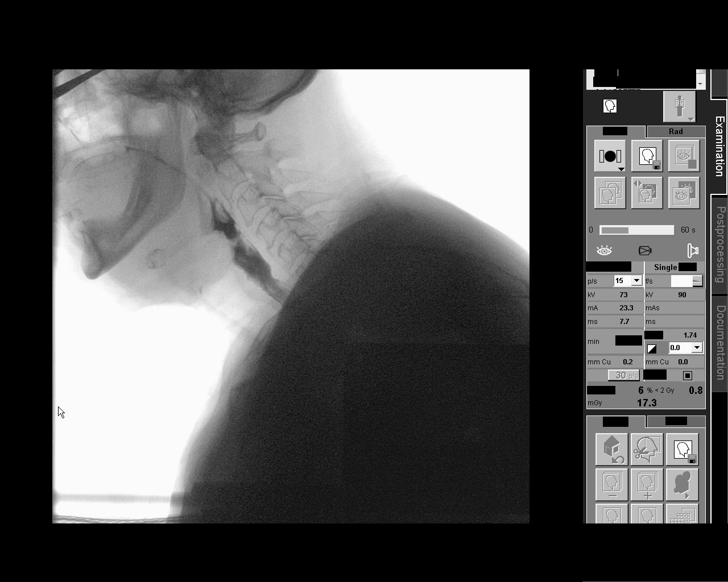

[13 of 24 positions shown; findings below may reference images not displayed]

FINDINGS: Thin liquid- minimal single asymptomatic aspiration with multiple
swallows, otherwise normal.

Nectar thick liquid- mild delayed swallow trigger, otherwise normal.

Mumu?Caplan mild vallecular retention, otherwise normal .

Mumu?Rajendra with cracker- very mild vallecular retention, otherwise
normal.
IMPRESSION: Minimal swallowing abnormalities as described above. Reference is
made to speech pathologist report.

Please refer to the Speech Pathologists report for complete details
and recommendations.

## 2016-02-18 ENCOUNTER — Ambulatory Visit (INDEPENDENT_AMBULATORY_CARE_PROVIDER_SITE_OTHER): Payer: Medicare Other | Admitting: Podiatry

## 2016-02-18 ENCOUNTER — Encounter: Payer: Self-pay | Admitting: Podiatry

## 2016-02-18 DIAGNOSIS — E1169 Type 2 diabetes mellitus with other specified complication: Secondary | ICD-10-CM

## 2016-02-18 DIAGNOSIS — B351 Tinea unguium: Secondary | ICD-10-CM

## 2016-02-18 DIAGNOSIS — D649 Anemia, unspecified: Secondary | ICD-10-CM | POA: Insufficient documentation

## 2016-02-18 DIAGNOSIS — M79676 Pain in unspecified toe(s): Secondary | ICD-10-CM | POA: Diagnosis not present

## 2016-02-18 NOTE — Progress Notes (Signed)
He presents today chief complaint of painful elongated toenails.  Objective: Vital signs stable alert and oriented 3. Pulses are strong and palpable bilateral. Toenails are thick yellow dystrophic with mycotic and painful palpation.  Assessment: Pain in limb secondary to onychomycosis.  Plan: Debridement of toenails 1 through 5 bilateral.

## 2016-04-21 ENCOUNTER — Encounter: Payer: Self-pay | Admitting: Podiatry

## 2016-04-21 ENCOUNTER — Ambulatory Visit (INDEPENDENT_AMBULATORY_CARE_PROVIDER_SITE_OTHER): Payer: Medicare Other | Admitting: Podiatry

## 2016-04-21 DIAGNOSIS — E1169 Type 2 diabetes mellitus with other specified complication: Secondary | ICD-10-CM

## 2016-04-21 DIAGNOSIS — B351 Tinea unguium: Secondary | ICD-10-CM

## 2016-04-21 DIAGNOSIS — M79676 Pain in unspecified toe(s): Secondary | ICD-10-CM

## 2016-04-21 NOTE — Progress Notes (Signed)
He presents today with chief complaint of painful elongated toenails.   objective: vital signs stable alert and oriented 3. No open lesions or wounds. Pulses are strongly palpable. Toenails are thick yellow dystrophic onychomycotic and painful palpation.  Assessment: Pain in limb secondary to onychomycosis.  Plan: Debridement of toenails 1 through 5 bilateral.

## 2016-07-23 ENCOUNTER — Ambulatory Visit: Payer: Medicare Other | Admitting: Podiatry

## 2016-07-30 ENCOUNTER — Ambulatory Visit (INDEPENDENT_AMBULATORY_CARE_PROVIDER_SITE_OTHER): Payer: Medicare Other | Admitting: Podiatry

## 2016-07-30 ENCOUNTER — Encounter: Payer: Self-pay | Admitting: Podiatry

## 2016-07-30 DIAGNOSIS — M79676 Pain in unspecified toe(s): Secondary | ICD-10-CM | POA: Diagnosis not present

## 2016-07-30 DIAGNOSIS — B351 Tinea unguium: Secondary | ICD-10-CM

## 2016-07-30 DIAGNOSIS — E1169 Type 2 diabetes mellitus with other specified complication: Secondary | ICD-10-CM

## 2016-07-30 NOTE — Progress Notes (Signed)
He presents today with chief complaint of painful elongated toenails 1 through 5 bilateral.  Pulses are palpable. Toenails are thick yellow dystrophic onychomycotic.  Assessment: Pain in limb secondary onychomycosis.  Plan: Debridement of toenails 1 through 5 bilateral.

## 2016-10-17 ENCOUNTER — Observation Stay: Payer: Medicare Other

## 2016-10-17 ENCOUNTER — Emergency Department: Payer: Medicare Other

## 2016-10-17 ENCOUNTER — Observation Stay
Admission: EM | Admit: 2016-10-17 | Discharge: 2016-10-18 | Disposition: A | Discharge status: Home / Self Care | Payer: Medicare Other | Attending: Internal Medicine | Admitting: Internal Medicine

## 2016-10-17 DIAGNOSIS — G20 Parkinson's disease: Secondary | ICD-10-CM | POA: Insufficient documentation

## 2016-10-17 DIAGNOSIS — R531 Weakness: Secondary | ICD-10-CM | POA: Diagnosis not present

## 2016-10-17 DIAGNOSIS — K5900 Constipation, unspecified: Secondary | ICD-10-CM | POA: Diagnosis not present

## 2016-10-17 DIAGNOSIS — E1165 Type 2 diabetes mellitus with hyperglycemia: Secondary | ICD-10-CM | POA: Insufficient documentation

## 2016-10-17 DIAGNOSIS — F0280 Dementia in other diseases classified elsewhere without behavioral disturbance: Secondary | ICD-10-CM | POA: Insufficient documentation

## 2016-10-17 DIAGNOSIS — Z8673 Personal history of transient ischemic attack (TIA), and cerebral infarction without residual deficits: Secondary | ICD-10-CM | POA: Insufficient documentation

## 2016-10-17 DIAGNOSIS — G309 Alzheimer's disease, unspecified: Secondary | ICD-10-CM | POA: Insufficient documentation

## 2016-10-17 DIAGNOSIS — I252 Old myocardial infarction: Secondary | ICD-10-CM | POA: Diagnosis not present

## 2016-10-17 DIAGNOSIS — Z23 Encounter for immunization: Secondary | ICD-10-CM | POA: Diagnosis not present

## 2016-10-17 DIAGNOSIS — N183 Chronic kidney disease, stage 3 unspecified: Secondary | ICD-10-CM

## 2016-10-17 DIAGNOSIS — Z7982 Long term (current) use of aspirin: Secondary | ICD-10-CM | POA: Insufficient documentation

## 2016-10-17 DIAGNOSIS — R112 Nausea with vomiting, unspecified: Secondary | ICD-10-CM | POA: Diagnosis present

## 2016-10-17 DIAGNOSIS — Z888 Allergy status to other drugs, medicaments and biological substances status: Secondary | ICD-10-CM | POA: Insufficient documentation

## 2016-10-17 DIAGNOSIS — G4733 Obstructive sleep apnea (adult) (pediatric): Secondary | ICD-10-CM | POA: Insufficient documentation

## 2016-10-17 DIAGNOSIS — J449 Chronic obstructive pulmonary disease, unspecified: Secondary | ICD-10-CM | POA: Diagnosis not present

## 2016-10-17 DIAGNOSIS — I129 Hypertensive chronic kidney disease with stage 1 through stage 4 chronic kidney disease, or unspecified chronic kidney disease: Secondary | ICD-10-CM | POA: Diagnosis not present

## 2016-10-17 DIAGNOSIS — Z951 Presence of aortocoronary bypass graft: Secondary | ICD-10-CM | POA: Insufficient documentation

## 2016-10-17 DIAGNOSIS — R001 Bradycardia, unspecified: Secondary | ICD-10-CM | POA: Diagnosis not present

## 2016-10-17 DIAGNOSIS — I10 Essential (primary) hypertension: Secondary | ICD-10-CM

## 2016-10-17 DIAGNOSIS — E871 Hypo-osmolality and hyponatremia: Secondary | ICD-10-CM | POA: Diagnosis not present

## 2016-10-17 DIAGNOSIS — I447 Left bundle-branch block, unspecified: Secondary | ICD-10-CM | POA: Diagnosis not present

## 2016-10-17 DIAGNOSIS — I700 Atherosclerosis of aorta: Secondary | ICD-10-CM | POA: Insufficient documentation

## 2016-10-17 DIAGNOSIS — Z87891 Personal history of nicotine dependence: Secondary | ICD-10-CM | POA: Diagnosis not present

## 2016-10-17 DIAGNOSIS — I340 Nonrheumatic mitral (valve) insufficiency: Secondary | ICD-10-CM | POA: Diagnosis not present

## 2016-10-17 DIAGNOSIS — I2510 Atherosclerotic heart disease of native coronary artery without angina pectoris: Secondary | ICD-10-CM | POA: Diagnosis not present

## 2016-10-17 DIAGNOSIS — Z955 Presence of coronary angioplasty implant and graft: Secondary | ICD-10-CM | POA: Insufficient documentation

## 2016-10-17 DIAGNOSIS — E1122 Type 2 diabetes mellitus with diabetic chronic kidney disease: Secondary | ICD-10-CM | POA: Diagnosis not present

## 2016-10-17 DIAGNOSIS — K760 Fatty (change of) liver, not elsewhere classified: Secondary | ICD-10-CM | POA: Insufficient documentation

## 2016-10-17 HISTORY — DX: Parkinson's disease: G20

## 2016-10-17 HISTORY — DX: Atherosclerotic heart disease of native coronary artery without angina pectoris: I25.10

## 2016-10-17 HISTORY — DX: Chronic kidney disease, stage 3 unspecified: N18.30

## 2016-10-17 HISTORY — DX: Parkinson's disease without dyskinesia, without mention of fluctuations: G20.A1

## 2016-10-17 HISTORY — DX: Chronic kidney disease, stage 3 (moderate): N18.3

## 2016-10-17 LAB — BASIC METABOLIC PANEL
ANION GAP: 8 (ref 5–15)
BUN: 17 mg/dL (ref 6–20)
CALCIUM: 9.2 mg/dL (ref 8.9–10.3)
CO2: 27 mmol/L (ref 22–32)
Chloride: 98 mmol/L — ABNORMAL LOW (ref 101–111)
Creatinine, Ser: 1.65 mg/dL — ABNORMAL HIGH (ref 0.61–1.24)
GFR calc Af Amer: 43 mL/min — ABNORMAL LOW (ref >60)
GFR calc non Af Amer: 37 mL/min — ABNORMAL LOW (ref >60)
Glucose, Bld: 129 mg/dL — ABNORMAL HIGH (ref 65–99)
POTASSIUM: 4.1 mmol/L (ref 3.5–5.1)
Sodium: 133 mmol/L — ABNORMAL LOW (ref 135–145)

## 2016-10-17 LAB — URINALYSIS COMPLETE WITH MICROSCOPIC (ARMC ONLY)
Bacteria, UA: NONE SEEN
Bilirubin Urine: NEGATIVE
Glucose, UA: NEGATIVE mg/dL
Hgb urine dipstick: NEGATIVE
Nitrite: NEGATIVE
PH: 5 (ref 5.0–8.0)
PROTEIN: 30 mg/dL — AB
SPECIFIC GRAVITY, URINE: 1.023 (ref 1.005–1.030)

## 2016-10-17 LAB — GLUCOSE, CAPILLARY: Glucose-Capillary: 125 mg/dL — ABNORMAL HIGH (ref 65–99)

## 2016-10-17 LAB — CBC
HEMATOCRIT: 41.7 % (ref 40.0–52.0)
HEMOGLOBIN: 14.5 g/dL (ref 13.0–18.0)
MCH: 32.6 pg (ref 26.0–34.0)
MCHC: 34.9 g/dL (ref 32.0–36.0)
MCV: 93.5 fL (ref 80.0–100.0)
Platelets: 156 10*3/uL (ref 150–440)
RBC: 4.46 MIL/uL (ref 4.40–5.90)
RDW: 13.6 % (ref 11.5–14.5)
WBC: 6.5 10*3/uL (ref 3.8–10.6)

## 2016-10-17 LAB — TROPONIN I
Troponin I: <0.03 ng/mL (ref <0.03)
Troponin I: <0.03 ng/mL (ref <0.03)

## 2016-10-17 LAB — TSH: TSH: 2.034 u[IU]/mL (ref 0.350–4.500)

## 2016-10-17 MED ORDER — METOPROLOL SUCCINATE ER 25 MG PO TB24: 25.0000 mg | ORAL_TABLET | Freq: Every day | ORAL | Status: DC

## 2016-10-17 MED ORDER — ENOXAPARIN SODIUM 40 MG/0.4ML ~~LOC~~ SOLN
40.0000 mg | SUBCUTANEOUS | Status: DC
  Administered 2016-10-17: 40 mg via SUBCUTANEOUS
  Filled 2016-10-17: qty 0.4

## 2016-10-17 MED ORDER — ONDANSETRON HCL 4 MG PO TABS: 4.0000 mg | ORAL_TABLET | Freq: Four times a day (QID) | ORAL | Status: DC | PRN

## 2016-10-17 MED ORDER — PANTOPRAZOLE SODIUM 40 MG PO TBEC
40.0000 mg | DELAYED_RELEASE_TABLET | Freq: Every day | ORAL | Status: DC
  Administered 2016-10-18: 40 mg via ORAL
  Filled 2016-10-17: qty 1

## 2016-10-17 MED ORDER — HYDROCODONE-ACETAMINOPHEN 5-325 MG PO TABS: 1.0000 | ORAL_TABLET | ORAL | Status: DC | PRN

## 2016-10-17 MED ORDER — SERTRALINE HCL 50 MG PO TABS
50.0000 mg | ORAL_TABLET | Freq: Every day | ORAL | Status: DC
  Administered 2016-10-18: 50 mg via ORAL
  Filled 2016-10-17: qty 1

## 2016-10-17 MED ORDER — SODIUM CHLORIDE 0.9% FLUSH: 3.0000 mL | Freq: Two times a day (BID) | INTRAVENOUS | Status: DC

## 2016-10-17 MED ORDER — AMLODIPINE BESYLATE 10 MG PO TABS
10.0000 mg | ORAL_TABLET | Freq: Every day | ORAL | Status: DC
  Administered 2016-10-17 – 2016-10-18 (×2): 10 mg via ORAL
  Filled 2016-10-17 (×2): qty 1

## 2016-10-17 MED ORDER — ACETAMINOPHEN 325 MG PO TABS: 650.0000 mg | ORAL_TABLET | Freq: Four times a day (QID) | ORAL | Status: DC | PRN

## 2016-10-17 MED ORDER — ASPIRIN EC 81 MG PO TBEC
81.0000 mg | DELAYED_RELEASE_TABLET | Freq: Every day | ORAL | Status: DC
  Administered 2016-10-17: 81 mg via ORAL
  Filled 2016-10-17: qty 1

## 2016-10-17 MED ORDER — ACETAMINOPHEN 650 MG RE SUPP: 650.0000 mg | Freq: Four times a day (QID) | RECTAL | Status: DC | PRN

## 2016-10-17 MED ORDER — ONDANSETRON HCL 4 MG/2ML IJ SOLN: 4.0000 mg | Freq: Four times a day (QID) | INTRAMUSCULAR | Status: DC | PRN

## 2016-10-17 MED ORDER — DOCUSATE SODIUM 100 MG PO CAPS
100.0000 mg | ORAL_CAPSULE | Freq: Two times a day (BID) | ORAL | Status: DC
  Administered 2016-10-17 – 2016-10-18 (×2): 100 mg via ORAL
  Filled 2016-10-17 (×2): qty 1

## 2016-10-17 MED ORDER — BUSPIRONE HCL 5 MG PO TABS: 7.5000 mg | ORAL_TABLET | Freq: Two times a day (BID) | ORAL | Status: DC | PRN

## 2016-10-17 MED ORDER — PRAVASTATIN SODIUM 40 MG PO TABS
40.0000 mg | ORAL_TABLET | Freq: Every day | ORAL | Status: DC
  Administered 2016-10-17: 40 mg via ORAL
  Filled 2016-10-17: qty 1

## 2016-10-17 MED ORDER — INFLUENZA VAC SPLIT QUAD 0.5 ML IM SUSY
0.5000 mL | PREFILLED_SYRINGE | INTRAMUSCULAR | Status: AC
Start: 2016-10-18 — End: 2016-10-18
  Administered 2016-10-18: 0.5 mL via INTRAMUSCULAR
  Filled 2016-10-17: qty 0.5

## 2016-10-17 MED ORDER — DONEPEZIL HCL 5 MG PO TABS
5.0000 mg | ORAL_TABLET | Freq: Every evening | ORAL | Status: DC
  Administered 2016-10-17: 5 mg via ORAL
  Filled 2016-10-17: qty 1

## 2016-10-17 MED ORDER — MELOXICAM 7.5 MG PO TABS: 15.0000 mg | ORAL_TABLET | Freq: Every day | ORAL | Status: DC | PRN

## 2016-10-17 MED ORDER — POLYETHYLENE GLYCOL 3350 17 G PO PACK: 17.0000 g | PACK | Freq: Every day | ORAL | Status: DC | PRN

## 2016-10-17 MED ORDER — SODIUM CHLORIDE 0.9 % IV SOLN
Freq: Once | INTRAVENOUS | Status: AC
  Administered 2016-10-17: 15:00:00 via INTRAVENOUS

## 2016-10-17 MED ORDER — SODIUM CHLORIDE 0.9 % IV SOLN
INTRAVENOUS | Status: DC
  Administered 2016-10-17 – 2016-10-18 (×2): via INTRAVENOUS

## 2016-10-17 MED ORDER — CARBIDOPA-LEVODOPA 25-100 MG PO TABS
1.0000 | ORAL_TABLET | Freq: Three times a day (TID) | ORAL | Status: DC
  Administered 2016-10-17 – 2016-10-18 (×3): 1 via ORAL
  Filled 2016-10-17 (×3): qty 1

## 2016-10-17 NOTE — ED Provider Notes (Signed)
[facility] Emergency Department Provider Note        Time seen: ----------------------------------------- 2:47 PM on 10/17/2016 -----------------------------------------    I have reviewed the triage vital signs and the nursing notes.   HISTORY  Chief Complaint Weakness    HPI [name] is a 82 y.o. male who presents the ER for generalized fatigue after 5 days of vomiting. Family states he has been vomiting on and off for the past 5 days and he is still very weak. He has had poor by mouth intake, no vomiting or abdominal pain. Patient does have history of dementia and Parkinson's, typically has been declining recently but much worse over the last 5 days. Patient cannot describe his weakness. He denies fevers, chills, chest pain, shortness of breath or diarrhea.   Past Medical History:  Diagnosis Date  . Asthma   . COPD (chronic obstructive pulmonary disease) (HCC)   . 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 Diabetes mellitus   . Hypertension   . Myocardial infarction   . OSA (obstructive sleep apnea)   . Shortness of breath   . Stroke [facility]     Patient Active Problem List   Diagnosis Date Noted  . Absolute anemia 02/18/2016  . Major depression in remission (HCC) 11/20/2015  . Chronic kidney disease (CKD), stage III (moderate) 01/25/2015  . Parkinson's disease (HCC) 01/25/2015  . Arteriosclerosis of coronary artery 07/28/2014  . History of carotid endarterectomy 07/28/2014  . DD (diverticular disease) 07/28/2014  . Calculus of kidney 07/28/2014  . Peripheral vascular disease (HCC) 07/28/2014  . Thrombocytopenia (HCC) 07/28/2014  . Dementia 03/09/2014  . Obstructive apnea 03/09/2014  . Pure hypercholesterolemia 03/09/2014  . Type 2 diabetes mellitus (HCC) 03/09/2014  . COPD 06/06/2013  . Essential hypertension, benign 06/06/2013  . Chronic obstructive pulmonary disease (HCC) 06/06/2013  . Depression 05/16/2011  . Memory  loss 05/16/2011  . Poor circulation 05/16/2011    Past Surgical History:  Procedure Laterality Date  . CARDIAC CATHETERIZATION    . CORONARY ARTERY BYPASS GRAFT    . EYE SURGERY    . NASAL SINUS SURGERY      Allergies Lipitor [atorvastatin]  Social History Social History  Substance Use Topics  . Smoking status: Former Smoker    Packs/day: 2.00    Years: 50.00    Types: Cigarettes    Quit date: 12/18/2005  . Smokeless tobacco: Never Used  . Alcohol use No    Review of Systems Constitutional: Negative for fever. Cardiovascular: Negative for chest pain. Respiratory: Negative for shortness of breath. Gastrointestinal: Negative for abdominal pain, Positive for recent vomiting Genitourinary: Negative for dysuria. Musculoskeletal: Negative for back pain. Skin: Negative for rash. Neurological: Positive for generalized weakness  10-point ROS otherwise negative.  ____________________________________________   PHYSICAL EXAM:  VITAL SIGNS: ED Triage Vitals  Enc Vitals Group     BP 10/17/16 1249 (!) 132/55     Pulse Rate 10/17/16 1249 (!) 55     Resp 10/17/16 1249 20     Temp 10/17/16 1249 98 F (36.7 C)     Temp Source 10/17/16 1249 Oral     SpO2 10/17/16 1249 95 %     Weight 10/17/16 1250 174 lb (78.9 kg)     Height --      Head Circumference --      Peak Flow --      Pain Score --      Pain Loc --  Pain Edu? --      Excl. in GC? --     Constitutional: Alert, No acute distress Eyes: Conjunctivae are normal. PERRL. Normal extraocular movements. ENT   Head: Normocephalic and atraumatic.   Nose: No congestion/rhinnorhea.   Mouth/Throat: Mucous membranes are moist.   Neck: No stridor. Cardiovascular: Normal rate, regular rhythm. No murmurs, rubs, or gallops. Respiratory: Normal respiratory effort without tachypnea nor retractions. Breath sounds are clear and equal bilaterally. No wheezes/rales/rhonchi. Gastrointestinal: Soft and nontender.  Normal bowel sounds Musculoskeletal: Nontender with normal range of motion in all extremities. No lower extremity tenderness nor edema. Neurologic:  Normal speech and language. No gross focal neurologic deficits are appreciated. Generalized weakness, nothing focal Skin:  Skin is warm, dry and intact. No rash noted. Psychiatric: Mood and affect are normal. Speech and behavior are normal.  ____________________________________________  EKG: Interpreted by me. Sinus rhythm with a rate of 63 bpm, normal PR interval, wide QRS, long QT, left axis deviation, left bundle branch block. PVCs  ____________________________________________  ED COURSE:  Pertinent labs & imaging results that were available during my care of the patient were reviewed by me and considered in my medical decision making (see chart for details). Clinical Course  Patient presents to ER with generalized weakness, likely dehydration related. We will assess with basic labs, give IV fluid and reevaluate.  Procedures ____________________________________________   LABS (pertinent positives/negatives)  Labs Reviewed  BASIC METABOLIC PANEL - Abnormal; Notable for the following:       Result Value   Sodium 133 (*)    Chloride 98 (*)    Glucose, Bld 129 (*)    Creatinine, Ser 1.65 (*)    GFR calc non Af Amer 37 (*)    GFR calc Af Amer 43 (*)    All other components within normal limits  URINALYSIS COMPLETEWITH MICROSCOPIC ([facility] ONLY) - Abnormal; Notable for the following:    Color, Urine AMBER (*)    APPearance CLEAR (*)    Ketones, ur TRACE (*)    Protein, ur 30 (*)    Leukocytes, UA TRACE (*)    Squamous Epithelial / LPF 0-5 (*)    All other components within normal limits  GLUCOSE, CAPILLARY - Abnormal; Notable for the following:    Glucose-Capillary 125 (*)    All other components within normal limits  CBC  TROPONIN I  CBG MONITORING, ED   ____________________________________________  FINAL ASSESSMENT AND  PLAN  Weakness, New left bundle branch block  Plan: Patient with labs as dictated above. Patient presented with weakness likely secondary to recent vomiting and mild dehydration. He was given normal saline here. Also noted an abnormal EKG in that he has a new left bundle-branch block. He has not had any chest pain and his troponin is negative. Family is requesting observation due to weakness. I will discuss with the hospitalist.   Emily FilbertWilliams, Ugo Thoma E, MD   Note: This dictation was prepared with Dragon dictation. Any transcriptional errors that result from this process are unintentional    Emily FilbertJonathan E Rylee Nuzum, MD 10/17/16 1555

## 2016-10-17 NOTE — Progress Notes (Signed)
Md notified pt'se HR 45-50. Order to hold metoprolol tonight.

## 2016-10-17 NOTE — Care Management Obs Status (Signed)
MEDICARE OBSERVATION STATUS NOTIFICATION   Patient Details  Name: [name] MRN: [id number] Date of Birth: 09/02/1933   Medicare Observation Status Notification Given:  Yes    Caren MacadamMichelle Taite Baldassari, RN 10/17/2016, 6:22 PM

## 2016-10-17 NOTE — H&P (Signed)
[facility] NAME: [name]    MR#:  [id number]  DATE OF BIRTH:  08-21-1933  DATE OF ADMISSION:  10/17/2016  PRIMARY CARE PHYSICIAN: Adin Hector, MD   REQUESTING/REFERRING PHYSICIAN: Dr. Lenise Arena  CHIEF COMPLAINT:   Chief Complaint  Patient presents with  . Weakness    HISTORY OF PRESENT ILLNESS:  [name]  is a 82 y.o. male with a known history of CAD status post stent, COPD, CK D, hypertension, diabetes mellitus, dementia and Parkinson's disease brought in from home secondary to intractable nausea and vomiting going on for 4-5 days now. Due to his dementia, patient is not a great historian, most of the history obtained from his wife at bedside. According to her, patient has been recently diagnosed with Parkinson's disease. He started with nausea and vomiting about 5 days ago. He went to see his PCP and had x-rays done which were unremarkable. He was having trouble with constipation prior but that has relieved now. Denies eating any outside food, no recent travel. No fevers or chills. No diarrhea associated with it. Since his symptoms have not resolved, he was sent into the emergency room. Patient usually does not complain of any pain. On examining his abdomen he did complain of mid abdominal pain. He still has his gallbladder in. No jaundice noted. Also has been extremely weak according to his wife and unable to ambulate. Labs showing hyponatremia. EKG with left bundle branch block.  PAST MEDICAL HISTORY:   Past Medical History:  Diagnosis Date  . Asthma   . CAD (coronary artery disease)    Status post left circumflex stent  . CKD (chronic kidney disease) stage 3, GFR 30-59 ml/min   . COPD (chronic obstructive pulmonary disease) ([place])   . 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 Diabetes mellitus   . Hypertension   . Myocardial infarction   . OSA (obstructive sleep apnea)   . Parkinson's  disease ([place])   . Shortness of breath   . Stroke [facility])     PAST SURGICAL HISTORY:   Past Surgical History:  Procedure Laterality Date  . CARDIAC CATHETERIZATION    . CORONARY ARTERY BYPASS GRAFT    . EYE SURGERY    . NASAL SINUS SURGERY      SOCIAL HISTORY:   Social History  Substance Use Topics  . Smoking status: Former Smoker    Packs/day: 2.00    Years: 50.00    Types: Cigarettes    Quit date: 12/18/2005  . Smokeless tobacco: Never Used     Comment: Quit about 12 years ago  . Alcohol use No    FAMILY HISTORY:   Family History  Problem Relation Age of Onset  . Heart disease Father   . Cancer Father     DRUG ALLERGIES:   Allergies  Allergen Reactions  . Lipitor [Atorvastatin] Rash    myalgia    REVIEW OF SYSTEMS:   Review of Systems  Constitutional: Positive for malaise/fatigue. Negative for chills, fever and weight loss.  HENT: Negative for ear discharge, ear pain, hearing loss and nosebleeds.   Eyes: Negative for blurred vision, double vision and photophobia.  Respiratory: Negative for cough, hemoptysis, shortness of breath and wheezing.   Cardiovascular: Negative for chest pain, palpitations, orthopnea and leg swelling.  Gastrointestinal: Positive for abdominal pain, nausea and vomiting. Negative for constipation, diarrhea, heartburn and melena.  Genitourinary: Negative for dysuria, frequency  and urgency.  Musculoskeletal: Negative for back pain, myalgias and neck pain.  Skin: Negative for rash.  Neurological: Positive for tremors. Negative for dizziness, tingling, sensory change, speech change, focal weakness and headaches.  Endo/Heme/Allergies: Does not bruise/bleed easily.  Psychiatric/Behavioral: Negative for depression.    MEDICATIONS AT HOME:   Prior to Admission medications   Medication Sig Start Date End Date Taking? Authorizing Provider  amLODipine (NORVASC) 10 MG tablet take 1 tablet by mouth daily 02/03/16  Yes Historical Provider, MD    aspirin EC 81 MG tablet Take 81 mg by mouth at bedtime.    Yes Historical Provider, MD  busPIRone (BUSPAR) 15 MG tablet Take 7.5 mg by mouth 2 (two) times daily as needed.   Yes Historical Provider, MD  carbidopa-levodopa (SINEMET IR) 25-100 MG tablet Take 1 tablet by mouth 3 (three) times daily.   Yes Historical Provider, MD  cyanocobalamin 500 MCG tablet Take 1,000 mcg by mouth daily.    Yes Historical Provider, MD  donepezil (ARICEPT) 5 MG tablet Take 5 mg by mouth daily.   Yes Historical Provider, MD  fluticasone (FLONASE) 50 MCG/ACT nasal spray Place 2 sprays into the nose as needed.    Yes Historical Provider, MD  losartan (COZAAR) 100 MG tablet Take 100 mg by mouth daily.    Yes Historical Provider, MD  lovastatin (MEVACOR) 40 MG tablet Take 40 mg by mouth at bedtime.    Yes Historical Provider, MD  meloxicam (MOBIC) 15 MG tablet take 1 tablet by mouth daily as needed 01/18/16  Yes Historical Provider, MD  metoprolol succinate (TOPROL-XL) 50 MG 24 hr tablet Take 25 mg by mouth daily. Take with or immediately following a meal.   Yes Historical Provider, MD  Multiple Vitamin (MULTIVITAMIN WITH MINERALS) TABS tablet Take 1 tablet by mouth daily.   Yes Historical Provider, MD  omeprazole (PRILOSEC) 20 MG capsule Take 20 mg by mouth daily.   Yes Historical Provider, MD  ondansetron (ZOFRAN) 4 MG tablet Take 1-2 tabs by mouth every 8 hours as needed for nausea/vomiting 12/19/15  Yes Hinda Kehr, MD  sertraline (ZOLOFT) 50 MG tablet Take 50 mg by mouth daily.   Yes Historical Provider, MD  Blood Glucose Monitoring Suppl (ONE TOUCH ULTRA 2) w/Device KIT FPD 11/22/15   Historical Provider, MD  ONE TOUCH ULTRA TEST test strip USE TO TEST BLOOD SUGAR QD 11/22/15   Historical Provider, MD  Glory Rosebush DELICA LANCETS 98X MISC USE TO TEST BLOOD SUGAR QD 11/22/15   Historical Provider, MD  PREVNAR 13 SUSP injection ADM 0.5 ML IM 1 TIME FOR 1 DOSE 11/22/15   Historical Provider, MD      VITAL SIGNS:  Blood  pressure (!) 132/55, pulse (!) 55, temperature 97.8 F (36.6 C), temperature source Oral, resp. rate 20, weight 78.9 kg (174 lb), SpO2 95 %.  PHYSICAL EXAMINATION:   Physical Exam  GENERAL:  82 y.o.-year-old patient lying in the bed with no acute distress.  EYES: Pupils equal, round, reactive to light and accommodation. No scleral icterus. Postsurgical changes noted. Extraocular muscles intact.  HEENT: Head atraumatic, normocephalic. Oropharynx and nasopharynx clear.  NECK:  Supple, no jugular venous distention. No thyroid enlargement, no tenderness.  LUNGS: Normal breath sounds bilaterally, no wheezing, rales,rhonchi or crepitation. No use of accessory muscles of respiration. Decreased bibasilar breath sounds CARDIOVASCULAR: S1, S2 normal. No rubs, or gallops. 2/6 systolic murmur present ABDOMEN: Soft, nontender but discomfort on palpating and epigastric and periumbilical region. No guarding or rigidity.,  nondistended. Bowel sounds present. No organomegaly or mass.  EXTREMITIES: No pedal edema, cyanosis, or clubbing.  NEUROLOGIC: Cranial nerves II through XII are intact. Muscle strength 5/5 in all extremities. Sensation intact. Gait not checked. Global weakness noted PSYCHIATRIC: The patient is alert and oriented to self.  SKIN: No obvious rash, lesion, or ulcer.   LABORATORY PANEL:   CBC  Recent Labs Lab 10/17/16 1252  WBC 6.5  HGB 14.5  HCT 41.7  PLT 156   ------------------------------------------------------------------------------------------------------------------  Chemistries   Recent Labs Lab 10/17/16 1252  NA 133*  K 4.1  CL 98*  CO2 27  GLUCOSE 129*  BUN 17  CREATININE 1.65*  CALCIUM 9.2   ------------------------------------------------------------------------------------------------------------------  Cardiac Enzymes  Recent Labs Lab 10/17/16 1431  TROPONINI <0.03    ------------------------------------------------------------------------------------------------------------------  RADIOLOGY:  Dg Chest 2 View  Result Date: 10/17/2016 CLINICAL DATA:  Vomiting x5 days with weakness.  Nonsmoker. EXAM: CHEST  2 VIEW COMPARISON:  12/19/2015 FINDINGS: The cardiac silhouette is within normal limits for size. Stable atheromatous calcification of the aortic arch. Mild age related interstitial prominence/fibrosis without pneumonic consolidation, CHF, effusion or pneumothorax. No suspicious osseous lesions. IMPRESSION: No active cardiopulmonary disease. Electronically Signed   By: Ashley Royalty M.D.   On: 10/17/2016 15:11   US Abdomen Limited Ruq  Result Date: 10/17/2016 CLINICAL DATA:  Patient with nausea and vomiting for multiple weeks. EXAM: US ABDOMEN LIMITED - RIGHT UPPER QUADRANT COMPARISON:  None. FINDINGS: Gallbladder: No gallstones or wall thickening visualized. No sonographic Murphy sign noted by sonographer. Common bile duct: Diameter: 3.6 mm Liver: Diffusely increased in echogenicity.  No focal lesion identified. IMPRESSION: Hepatic steatosis. No cholelithiasis or sonographic evidence for acute cholecystitis. Electronically Signed   By: Lovey Newcomer M.D.   On: 10/17/2016 17:24    EKG:   Orders placed or performed during the hospital encounter of 10/17/16  . ED EKG  . ED EKG    IMPRESSION AND PLAN:   Errol Ala  is a 82 y.o. male with a known history of CAD status post stent, COPD, CK D, hypertension, diabetes mellitus, dementia and Parkinson's disease brought in from home secondary to intractable nausea and vomiting going on for 4-5 days now.  #1 intractable nausea and vomiting- gastroenteritis. -X-ray of the abdomen done 4 days ago at [facility], unable to view results - check [place] of abdomen for any gall bladder issues Until then, IV zofran prn, IV fluids - on clear liquid diet - If no improvement- consider CT abdomen  #2  hyponatremia-likely hypovolemic. Gentle hydration and recheck  #3 CKD-known CK D stage III. Baseline creatinine between 1.5 and 2. Seems to be at baseline at this time. Monitor  #4 dementia-seems to be at baseline. Pleasantly confused. Continue Aricept. Also on antidepressants and anxiety medications  #5 Parkinson's disease-following with neurology as outpatient. Continue Sinemet  #6 CAD-stable at this time. Continue cardiac medications. Aspirin, metoprolol, statin. Hold Norvasc and losartan as blood pressure is low normal  #7 DVT prophylaxis-on Lovenox  Physical therapy consult requested for weakness    All the records are reviewed and case discussed with ED provider. Management plans discussed with the patient, family and they are in agreement.  CODE STATUS: Full code  TOTAL TIME TAKING CARE OF THIS PATIENT: 50 minutes.    Ramello Cordial M.D on 10/17/2016 at 5:52 PM  Between 7am to 6pm - Pager - [phone]  After 6pm go to [email or web address] - password [email or web address]  [employer]  [phone]  CC: Primary care physician; Adin Hector, MD

## 2016-10-17 NOTE — ED Notes (Signed)
Patient transported to Ultrasound 

## 2016-10-17 NOTE — ED Triage Notes (Signed)
Pt arrives to ER via POV c/o generalized fatigue after 5 days of vomiting. Pt states that vomiting has now stopped but he is still very weak. Pt alert and oriented X4, active, cooperative, pt in NAD. RR even and unlabored, color WNL.

## 2016-10-17 NOTE — ED Notes (Signed)
Patient wife came to nurses station stating that patient was cold, RN rechecked patient temperature and patient was given another warm blanket.

## 2016-10-18 ENCOUNTER — Observation Stay
Admit: 2016-10-18 | Discharge: 2016-10-18 | Disposition: A | Discharge status: Home / Self Care | Payer: Medicare Other | Attending: Internal Medicine | Admitting: Internal Medicine

## 2016-10-18 ENCOUNTER — Observation Stay: Admit: 2016-10-18 | Payer: Medicare Other

## 2016-10-18 DIAGNOSIS — K5900 Constipation, unspecified: Secondary | ICD-10-CM

## 2016-10-18 DIAGNOSIS — N183 Chronic kidney disease, stage 3 unspecified: Secondary | ICD-10-CM

## 2016-10-18 DIAGNOSIS — I447 Left bundle-branch block, unspecified: Secondary | ICD-10-CM | POA: Diagnosis not present

## 2016-10-18 DIAGNOSIS — R001 Bradycardia, unspecified: Secondary | ICD-10-CM

## 2016-10-18 DIAGNOSIS — I10 Essential (primary) hypertension: Secondary | ICD-10-CM

## 2016-10-18 DIAGNOSIS — E871 Hypo-osmolality and hyponatremia: Secondary | ICD-10-CM

## 2016-10-18 LAB — BASIC METABOLIC PANEL
ANION GAP: 8 (ref 5–15)
BUN: 15 mg/dL (ref 6–20)
CALCIUM: 8.4 mg/dL — AB (ref 8.9–10.3)
CO2: 21 mmol/L — ABNORMAL LOW (ref 22–32)
Chloride: 105 mmol/L (ref 101–111)
Creatinine, Ser: 1.48 mg/dL — ABNORMAL HIGH (ref 0.61–1.24)
GFR calc Af Amer: 49 mL/min — ABNORMAL LOW (ref >60)
GFR, EST NON AFRICAN AMERICAN: 43 mL/min — AB (ref >60)
GLUCOSE: 103 mg/dL — AB (ref 65–99)
POTASSIUM: 3.7 mmol/L (ref 3.5–5.1)
SODIUM: 134 mmol/L — AB (ref 135–145)

## 2016-10-18 LAB — GLUCOSE, CAPILLARY
GLUCOSE-CAPILLARY: 130 mg/dL — AB (ref 65–99)
Glucose-Capillary: 108 mg/dL — ABNORMAL HIGH (ref 65–99)

## 2016-10-18 LAB — CBC
HEMATOCRIT: 36.9 % — AB (ref 40.0–52.0)
Hemoglobin: 13.1 g/dL (ref 13.0–18.0)
MCH: 32.6 pg (ref 26.0–34.0)
MCHC: 35.4 g/dL (ref 32.0–36.0)
MCV: 92 fL (ref 80.0–100.0)
Platelets: 141 10*3/uL — ABNORMAL LOW (ref 150–440)
RBC: 4.01 MIL/uL — ABNORMAL LOW (ref 4.40–5.90)
RDW: 13.8 % (ref 11.5–14.5)
WBC: 5.5 10*3/uL (ref 3.8–10.6)

## 2016-10-18 LAB — TROPONIN I: TROPONIN I: 0.01 ng/mL (ref <0.03)

## 2016-10-18 LAB — OSMOLALITY, URINE: OSMOLALITY UR: 469 mosm/kg (ref 300–900)

## 2016-10-18 MED ORDER — INSULIN ASPART 100 UNIT/ML ~~LOC~~ SOLN
0.0000 [IU] | Freq: Every day | SUBCUTANEOUS | Status: DC
Start: 2016-10-18 — End: 2016-10-18

## 2016-10-18 MED ORDER — INSULIN ASPART 100 UNIT/ML ~~LOC~~ SOLN: 3.0000 [IU] | Freq: Three times a day (TID) | SUBCUTANEOUS | Status: DC

## 2016-10-18 MED ORDER — DOCUSATE SODIUM 100 MG PO CAPS: 100.0000 mg | ORAL_CAPSULE | Freq: Two times a day (BID) | ORAL | 0 refills | Status: DC

## 2016-10-18 MED ORDER — METOPROLOL SUCCINATE ER 25 MG PO TB24: 25.0000 mg | ORAL_TABLET | Freq: Every day | ORAL | 5 refills | Status: DC

## 2016-10-18 MED ORDER — INSULIN ASPART 100 UNIT/ML ~~LOC~~ SOLN
0.0000 [IU] | Freq: Three times a day (TID) | SUBCUTANEOUS | Status: DC
  Administered 2016-10-18: 1 [IU] via SUBCUTANEOUS
  Filled 2016-10-18: qty 1

## 2016-10-18 MED ORDER — POLYETHYLENE GLYCOL 3350 17 G PO PACK: 17.0000 g | PACK | Freq: Every day | ORAL | 0 refills | Status: DC | PRN

## 2016-10-18 NOTE — Progress Notes (Addendum)
Pt A and O x 2. VSS. Pt tolerating diet well. No complaints of pain or nausea. IV removed intact, prescriptions given. Patients wife voiced understanding of discharge instructions with no further questions. Pt instructed to start taking metoprolol on Monday at a decreased dose. Pt wife given a pil cutter for his medication. Pt discharged via wheelchair with nurse aide.Home health RN, PT , and OT has been setup.

## 2016-10-18 NOTE — Discharge Summary (Signed)
[facility] [place] NAME: [name]    MR#:  [id number]  DATE OF BIRTH:  06-01-1933  DATE OF ADMISSION:  10/17/2016 ADMITTING PHYSICIAN: [name], MD  DATE OF DISCHARGE: No discharge date for patient encounter.  PRIMARY CARE PHYSICIAN: [name], MD     ADMISSION DIAGNOSIS:  Left bundle branch block [I44.7] Weakness [R53.1] Nausea & vomiting [R11.2]  DISCHARGE DIAGNOSIS:  Principal Problem:   Nausea & vomiting Active Problems:   Hyponatremia   Bradycardia   Malignant essential hypertension   Constipation   CKD (chronic kidney disease), stage III   SECONDARY DIAGNOSIS:   Past Medical History:  Diagnosis Date  . Asthma   . CAD (coronary artery disease)    Status post left circumflex stent  . CKD (chronic kidney disease) stage 3, GFR 30-59 ml/min   . COPD (chronic obstructive pulmonary disease) ([place])   . 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 Diabetes mellitus   . Hypertension   . Myocardial infarction   . OSA (obstructive sleep apnea)   . Parkinson's disease ([place])   . Shortness of breath   . Stroke ([place])     .pro HOSPITAL COURSE:   The patient is a 82 year old Caucasian male with medical history significant for history of coronary artery disease, asthma, CAD, COPD, also, dementia, diabetes mellitus, obstructive sleep apnea, Parkinson's disease, who presents to the hospital with nausea and vomiting, going on for the past 4-5 days. No diarrhea was noted, however, patient did have constipation. On arrival to the hospital. He complained of mid abdominal pain. X-ray of abdomen revealed or obstruction. No ileus or obstruction, however phleboliths were noted and pelvis. Ultrasound of abdomen, limited to right upper quadrant was unremarkable, but hepatic steatosis. Chest x-ray was normal. Patient was admitted to the hospital for further evaluation and treatment, initiated and supportive  therapy and his condition improved. On the day of discharge she was able to eat regular diet and had no nausea, vomiting or abdominal pain. He was recommended to continue stool softeners to help with constipation. He is awaiting for physical therapist evaluation, possible discharge home with home health , if physical therapy recommends it.  Discussion by problem: #1. Intractable nausea and vomiting, unclear etiology, could be self-limiting gastritis. X-ray of abdomen was unremarkable. Patient is to continue supportive therapy at home with Zofran as needed #2. Constipation, patient is prescribed Colace and MiraLAX #3. CK D stage III, remains stable and somewhat improved with IV fluid administration, now creatinine is at baseline. It is recommended to follow-up with primary care physician and possibly nephrologist as outpatient #4. Hyponatremia, some improvement with IV fluid administration. TSH was checked, was found to be normal. Urine osmolarity is pending #5. Bradycardia with beta blockers with sinus pause of 2.1 seconds, patient was seen by cardiologist, Dr. Clayborn Bigness and recommended to hold Toprol-XL until Monday, 2 days from now, and then restarted lower-dose of 25 mg daily. Echocardiogram is pending #6. Malignant essential hypertension, patient is to continue outpatient medications, follow-up with primary care physician to adjust blood pressure medications as needed #7. Hyperglycemia, hemoglobin A1c is pending #8. Generalized weakness, physical therapist evaluation is pending, likely discharge to home with home health services DISCHARGE CONDITIONS:   Stable  CONSULTS OBTAINED:  Treatment Team:  Yolonda Kida, MD  DRUG ALLERGIES:   Allergies  Allergen Reactions  . Lipitor [Atorvastatin] Rash    myalgia    DISCHARGE  MEDICATIONS:   Current Discharge Medication List    START taking these medications   Details  docusate sodium (COLACE) 100 MG capsule Take 1 capsule (100 mg total)  by mouth 2 (two) times daily. Qty: 10 capsule, Refills: 0    polyethylene glycol (MIRALAX / GLYCOLAX) packet Take 17 g by mouth daily as needed for mild constipation. Qty: 14 each, Refills: 0      CONTINUE these medications which have CHANGED   Details  metoprolol succinate (TOPROL-XL) 25 MG 24 hr tablet Take 1 tablet (25 mg total) by mouth daily. Qty: 30 tablet, Refills: 5      CONTINUE these medications which have NOT CHANGED   Details  amLODipine (NORVASC) 10 MG tablet take 1 tablet by mouth daily Refills: 3    aspirin EC 81 MG tablet Take 81 mg by mouth at bedtime.     busPIRone (BUSPAR) 15 MG tablet Take 7.5 mg by mouth 2 (two) times daily as needed.    carbidopa-levodopa (SINEMET IR) 25-100 MG tablet Take 1 tablet by mouth 3 (three) times daily.    cyanocobalamin 500 MCG tablet Take 1,000 mcg by mouth daily.     donepezil (ARICEPT) 5 MG tablet Take 5 mg by mouth daily.    fluticasone (FLONASE) 50 MCG/ACT nasal spray Place 2 sprays into the nose as needed.     losartan (COZAAR) 100 MG tablet Take 100 mg by mouth daily.     lovastatin (MEVACOR) 40 MG tablet Take 40 mg by mouth at bedtime.     meloxicam (MOBIC) 15 MG tablet take 1 tablet by mouth daily as needed Refills: 5    Multiple Vitamin (MULTIVITAMIN WITH MINERALS) TABS tablet Take 1 tablet by mouth daily.    omeprazole (PRILOSEC) 20 MG capsule Take 20 mg by mouth daily.    ondansetron (ZOFRAN) 4 MG tablet Take 1-2 tabs by mouth every 8 hours as needed for nausea/vomiting Qty: 30 tablet, Refills: 0    sertraline (ZOLOFT) 50 MG tablet Take 50 mg by mouth daily.    Blood Glucose Monitoring Suppl (ONE TOUCH ULTRA 2) w/Device KIT FPD Refills: 0    ONE TOUCH ULTRA TEST test strip USE TO TEST BLOOD SUGAR QD Refills: 0    ONETOUCH DELICA LANCETS 76B MISC USE TO TEST BLOOD SUGAR QD Refills: 0    PREVNAR 13 SUSP injection ADM 0.5 ML IM 1 TIME FOR 1 DOSE Refills: 0         DISCHARGE INSTRUCTIONS:    The  patient is to follow-up with primary care physician as outpatient  If you experience worsening of your admission symptoms, develop shortness of breath, life threatening emergency, suicidal or homicidal thoughts you must seek medical attention immediately by calling 911 or calling your MD immediately  if symptoms less severe.  You Must read complete instructions/literature along with all the possible adverse reactions/side effects for all the Medicines you take and that have been prescribed to you. Take any new Medicines after you have completely understood and accept all the possible adverse reactions/side effects.   Please note  You were cared for by a hospitalist during your hospital stay. If you have any questions about your discharge medications or the care you received while you were in the hospital after you are discharged, you can call the unit and asked to speak with the hospitalist on call if the hospitalist that took care of you is not available. Once you are discharged, your primary care physician will handle  any further medical issues. Please note that NO REFILLS for any discharge medications will be authorized once you are discharged, as it is imperative that you return to your primary care physician (or establish a relationship with a primary care physician if you do not have one) for your aftercare needs so that they can reassess your need for medications and monitor your lab values.    Today   CHIEF COMPLAINT:   Chief Complaint  Patient presents with  . Weakness    HISTORY OF PRESENT ILLNESS:  Niel Peretti  is a 82 y.o. male with a known history of coronary artery disease, asthma, CAD, COPD, also, dementia, diabetes mellitus, obstructive sleep apnea, Parkinson's disease, who presents to the hospital with nausea and vomiting, going on for the past 4-5 days. No diarrhea was noted, however, patient did have constipation. On arrival to the hospital. He complained of mid abdominal  pain. X-ray of abdomen revealed or obstruction. No ileus or obstruction, however phleboliths were noted and pelvis. Ultrasound of abdomen, limited to right upper quadrant was unremarkable, but hepatic steatosis. Chest x-ray was normal. Patient was admitted to the hospital for further evaluation and treatment, initiated and supportive therapy and his condition improved. On the day of discharge she was able to eat regular diet and had no nausea, vomiting or abdominal pain. He was recommended to continue stool softeners to help with constipation. He is awaiting for physical therapist evaluation, possible discharge home with home health , if physical therapy recommends it.  Discussion by problem: #1. Intractable nausea and vomiting, unclear etiology, could be self-limiting gastritis. X-ray of abdomen was unremarkable. Patient is to continue supportive therapy at home with Zofran as needed #2. Constipation, patient is prescribed Colace and MiraLAX #3. CK D stage III, remains stable and somewhat improved with IV fluid administration, now creatinine is at baseline. It is recommended to follow-up with primary care physician and possibly nephrologist as outpatient #4. Hyponatremia, some improvement with IV fluid administration. TSH was checked, was found to be normal. Urine osmolarity is pending #5. Bradycardia with beta blockers with sinus pause of 2.1 seconds, patient was seen by cardiologist, Dr. Clayborn Bigness and recommended to hold Toprol-XL until Monday, 2 days from now, and then restarted lower-dose of 25 mg daily. Echocardiogram is pending #6. Malignant essential hypertension, patient is to continue outpatient medications, follow-up with primary care physician to adjust blood pressure medications as needed #7. Hyperglycemia, hemoglobin A1c is pending #8. Generalized weakness, physical therapist evaluation is pending, likely discharge to home with home health services    VITAL SIGNS:  Blood pressure (!)  138/53, pulse 62, temperature 97.7 F (36.5 C), temperature source Oral, resp. rate 18, height 6' (1.829 m), weight 78.9 kg (174 lb), SpO2 96 %.  I/O:   Intake/Output Summary (Last 24 hours) at 10/18/16 1415 Last data filed at 10/18/16 1300  Gross per 24 hour  Intake             2754 ml  Output              700 ml  Net             2054 ml    PHYSICAL EXAMINATION:  GENERAL:  82 y.o.-year-old patient lying in the bed with no acute distress.  EYES: Pupils equal, round, reactive to light and accommodation. No scleral icterus. Extraocular muscles intact.  HEENT: Head atraumatic, normocephalic. Oropharynx and nasopharynx clear.  NECK:  Supple, no jugular venous distention. No thyroid enlargement, no  tenderness.  LUNGS: Normal breath sounds bilaterally, no wheezing, rales,rhonchi or crepitation. No use of accessory muscles of respiration.  CARDIOVASCULAR: S1, S2 normal. No murmurs, rubs, or gallops.  ABDOMEN: Soft, non-tender, non-distended. Bowel sounds present. No organomegaly or mass.  EXTREMITIES: No pedal edema, cyanosis, or clubbing.  NEUROLOGIC: Cranial nerves II through XII are intact. Muscle strength 5/5 in all extremities. Sensation intact. Gait not checked.  PSYCHIATRIC: The patient is alert and oriented x 3.  SKIN: No obvious rash, lesion, or ulcer.   DATA REVIEW:   CBC  Recent Labs Lab 10/18/16 0558  WBC 5.5  HGB 13.1  HCT 36.9*  PLT 141*    Chemistries   Recent Labs Lab 10/18/16 0558  NA 134*  K 3.7  CL 105  CO2 21*  GLUCOSE 103*  BUN 15  CREATININE 1.48*  CALCIUM 8.4*    Cardiac Enzymes  Recent Labs Lab 10/18/16 0558  TROPONINI 0.01    Microbiology Results  Results for orders placed or performed during the hospital encounter of 07/31/15  Culture, blood (routine x 2)     Status: None   Collection Time: 07/31/15  8:35 AM  Result Value Ref Range Status   Specimen Description BLOOD LEFT ANTECUBITAL  Final   Special Requests   Final    BOTTLES  DRAWN AEROBIC AND ANAEROBIC 4ML ANAEROBIC 6ML AEROBIC   Culture NO GROWTH 5 DAYS  Final   Report Status 08/05/2015 FINAL  Final  Culture, blood (routine x 2)     Status: None   Collection Time: 07/31/15  8:40 AM  Result Value Ref Range Status   Specimen Description BLOOD RESISTANT FATTY CASTS  Final   Special Requests   Final    BOTTLES DRAWN AEROBIC AND ANAEROBIC 6ML AEROBIC 8ML ANAEROBIC   Culture NO GROWTH 5 DAYS  Final   Report Status 08/05/2015 FINAL  Final    RADIOLOGY:  Dg Chest 2 View  Result Date: 10/17/2016 CLINICAL DATA:  Vomiting x5 days with weakness.  Nonsmoker. EXAM: CHEST  2 VIEW COMPARISON:  12/19/2015 FINDINGS: The cardiac silhouette is within normal limits for size. Stable atheromatous calcification of the aortic arch. Mild age related interstitial prominence/fibrosis without pneumonic consolidation, CHF, effusion or pneumothorax. No suspicious osseous lesions. IMPRESSION: No active cardiopulmonary disease. Electronically Signed   By: Ashley Royalty M.D.   On: 10/17/2016 15:11   Dg Abd 1 View  Result Date: 10/17/2016 CLINICAL DATA:  Nausea, vomiting. EXAM: ABDOMEN - 1 VIEW COMPARISON:  Radiographs of April 01, 2014. FINDINGS: The bowel gas pattern is normal. Vascular calcifications and phleboliths are seen in the pelvis. IMPRESSION: No evidence of bowel obstruction or ileus. Electronically Signed   By: Marijo Conception, M.D.   On: 10/17/2016 18:22   US Abdomen Limited Ruq  Result Date: 10/17/2016 CLINICAL DATA:  Patient with nausea and vomiting for multiple weeks. EXAM: US ABDOMEN LIMITED - RIGHT UPPER QUADRANT COMPARISON:  None. FINDINGS: Gallbladder: No gallstones or wall thickening visualized. No sonographic Murphy sign noted by sonographer. Common bile duct: Diameter: 3.6 mm Liver: Diffusely increased in echogenicity.  No focal lesion identified. IMPRESSION: Hepatic steatosis. No cholelithiasis or sonographic evidence for acute cholecystitis. Electronically Signed   By:  Lovey Newcomer M.D.   On: 10/17/2016 17:24    EKG:   Orders placed or performed during the hospital encounter of 10/17/16  . ED EKG  . ED EKG      Management plans discussed with the patient, family and they are  in agreement.  CODE STATUS:     Code Status Orders        Start     Ordered   10/17/16 1829  Full code  Continuous     10/17/16 1828    Code Status History    Date Active Date Inactive Code Status Order ID Comments User Context   This patient has a current code status but no historical code status.      TOTAL TIME TAKING CARE OF THIS PATIENT: 40 minutes.    Theodoro Grist M.D on 10/18/2016 at 2:15 PM  Between 7am to 6pm - Pager - [phone]  After 6pm go to [email or web address] - password [email or web address] [facility]  [facility]  Office  [phone]  CC: Primary care physician; Adin Hector, MD

## 2016-10-18 NOTE — Progress Notes (Signed)
Per Dr. Winona LegatoVaickute okay to discharge patient as urine osmalility results came back.

## 2016-10-18 NOTE — Evaluation (Signed)
Physical Therapy Evaluation Patient Details Name: [name] MRN: [id number] DOB: 01/26/1933 Today's Date: 10/18/2016   History of Present Illness  Patient is an 82 y/o male that presents generalized fatigue, found to have new onset L bundle branch block. He has a history of Alzheimer's and Parkinson's dementia.   Clinical Impression  Patient evaluated for generalized fatigue, appears related to new onset L bundle branch block. Per his wife, he only ambulates short distances at home. She reports no falls, reports patient largely only ambulates to the bathroom and back to his chair. He is noted to have crouched gait with short step lengths initially without AD, PT educated patient on use of RW which increased his stride length, allowed more natural erect posture, and increased his ability to ambulate prolonged distances. Would recommend HHPT to reinforce use of RW and proper technique as he was noted to revert to crouched posture when maneuvering in room/to avoid obstacles.     Follow Up Recommendations Home health PT    Equipment Recommendations  Rolling walker with 5" wheels    Recommendations for Other Services       Precautions / Restrictions Precautions Precautions: Fall Restrictions Weight Bearing Restrictions: No      Mobility  Bed Mobility Overal bed mobility: Independent             General bed mobility comments: No deficits identified with bed mobility.   Transfers Overall transfer level: Needs assistance Equipment used: Rolling walker (2 wheeled) Transfers: Sit to/from Stand Sit to Stand: Min guard;Min assist         General transfer comment: With RW on second attempt patient requires prolonged time to complete transfer as well as cuing for hand placement. Without AD, he requires min A and prolonged time to complete.   Ambulation/Gait Ambulation/Gait assistance: Supervision Ambulation Distance (Feet): 520 Feet Assistive device: Rolling walker (2  wheeled) Gait Pattern/deviations: Step-through pattern;Scissoring;Drifts right/left   Gait velocity interpretation: at or above normal speed for age/gender General Gait Details: Patient initially attempts to ambulate without AD, he demonstrated flexed/buckling knees and very short steps. PT opted for RW, which increased his stride length, and speed. He was noted to step out of RW and revert to flexed/crouched gait, however he responds to cue to keep his feet inside the walker.   Stairs            Wheelchair Mobility    Modified Rankin (Stroke Patients Only)       Balance Overall balance assessment: Needs assistance Sitting-balance support: No upper extremity supported Sitting balance-Leahy Scale: Good     Standing balance support: Bilateral upper extremity supported Standing balance-Leahy Scale: Good                               Pertinent Vitals/Pain Pain Assessment: No/denies pain    Home Living Family/patient expects to be discharged to:: Private residence Living Arrangements: Spouse/significant other Available Help at Discharge: Family;Available 24 hours/day (Lives with wife) Type of Home: House Home Access: Stairs to enter   Secretary/administratorntrance Stairs-Number of Steps: 1 Home Layout: One level Home Equipment: None      Prior Function Level of Independence: Independent         Comments: per wife patient performs minimal ambulation at home, really only goes to bathroom. Reports no falls, does not use any ADs.      Hand Dominance        Extremity/Trunk Assessment  Upper Extremity Assessment: Overall WFL for tasks assessed           Lower Extremity Assessment: Overall WFL for tasks assessed         Communication   Communication: No difficulties  Cognition Arousal/Alertness: Awake/alert Behavior During Therapy: WFL for tasks assessed/performed Overall Cognitive Status: History of cognitive impairments - at baseline                       General Comments      Exercises     Assessment/Plan    PT Assessment Patient needs continued PT services  PT Problem List Decreased strength;Decreased balance;Decreased knowledge of use of DME;Decreased cognition;Decreased mobility;Decreased safety awareness          PT Treatment Interventions DME instruction;Gait training;Therapeutic exercise;Therapeutic activities;Stair training;Balance training;Patient/family education    PT Goals (Current goals can be found in the Care Plan section)  Acute Rehab PT Goals Patient Stated Goal: To increase mobility level at home  PT Goal Formulation: With patient/family Time For Goal Achievement: 11/01/16 Potential to Achieve Goals: Fair    Frequency Min 2X/week   Barriers to discharge        Co-evaluation               End of Session Equipment Utilized During Treatment: Gait belt Activity Tolerance: Patient tolerated treatment well Patient left: in chair;with chair alarm set;with call bell/phone within reach;with family/visitor present Nurse Communication: Mobility status    Functional Assessment Tool Used: Clinical judgement  Functional Limitation: Mobility: Walking and moving around Mobility: Walking and Moving Around Current Status [phone](G8978): At least 1 percent but less than 20 percent impaired, limited or restricted Mobility: Walking and Moving Around Goal Status [phone](G8979): At least 1 percent but less than 20 percent impaired, limited or restricted    Time: 1339-1355 PT Time Calculation (min) (ACUTE ONLY): 16 min   Charges:   PT Evaluation $PT Eval Moderate Complexity: 1 Procedure     PT G Codes:   PT G-Codes **NOT FOR INPATIENT CLASS** Functional Assessment Tool Used: Clinical judgement  Functional Limitation: Mobility: Walking and moving around Mobility: Walking and Moving Around Current Status ([id number]([id number]): At least 1 percent but less than 20 percent impaired, limited or restricted Mobility: Walking and Moving  Around Goal Status [phone](G8979): At least 1 percent but less than 20 percent impaired, limited or restricted   Kerin RansomPatrick A McNamara, PT, DPT    10/18/2016, 3:55 PM

## 2016-10-18 NOTE — Progress Notes (Signed)
Notified Dr. Winona LegatoVaickute that patient had a R to R of 2.13 and PVc, is being called Sinus pause.

## 2016-10-18 NOTE — Progress Notes (Signed)
Verified in glucometer that pt had a blood sugar of 108. Per Dr. Winona LegatoVaickute okay to disconinue meal coverage and keep sliding scale in case patients sugar goes up later this afternoon

## 2016-10-19 LAB — HEMOGLOBIN A1C
Hgb A1c MFr Bld: 5.5 % (ref 4.8–5.6)
MEAN PLASMA GLUCOSE: 111 mg/dL

## 2016-10-19 LAB — ECHOCARDIOGRAM COMPLETE
Height: 72 in
WEIGHTICAEL: 2784 [oz_av]

## 2016-10-25 ENCOUNTER — Encounter: Payer: Self-pay | Admitting: Emergency Medicine

## 2016-10-25 ENCOUNTER — Inpatient Hospital Stay
Admission: EM | Admit: 2016-10-25 | Discharge: 2016-10-28 | DRG: 379 | Disposition: A | Discharge status: Home / Self Care | Payer: Medicare Other | Attending: Internal Medicine | Admitting: Internal Medicine

## 2016-10-25 DIAGNOSIS — G4733 Obstructive sleep apnea (adult) (pediatric): Secondary | ICD-10-CM | POA: Diagnosis present

## 2016-10-25 DIAGNOSIS — R001 Bradycardia, unspecified: Secondary | ICD-10-CM | POA: Diagnosis present

## 2016-10-25 DIAGNOSIS — Z7951 Long term (current) use of inhaled steroids: Secondary | ICD-10-CM

## 2016-10-25 DIAGNOSIS — N183 Chronic kidney disease, stage 3 (moderate): Secondary | ICD-10-CM | POA: Diagnosis present

## 2016-10-25 DIAGNOSIS — J449 Chronic obstructive pulmonary disease, unspecified: Secondary | ICD-10-CM | POA: Diagnosis present

## 2016-10-25 DIAGNOSIS — I252 Old myocardial infarction: Secondary | ICD-10-CM | POA: Diagnosis not present

## 2016-10-25 DIAGNOSIS — Z809 Family history of malignant neoplasm, unspecified: Secondary | ICD-10-CM

## 2016-10-25 DIAGNOSIS — I2510 Atherosclerotic heart disease of native coronary artery without angina pectoris: Secondary | ICD-10-CM | POA: Diagnosis present

## 2016-10-25 DIAGNOSIS — Z79899 Other long term (current) drug therapy: Secondary | ICD-10-CM | POA: Diagnosis not present

## 2016-10-25 DIAGNOSIS — Z8249 Family history of ischemic heart disease and other diseases of the circulatory system: Secondary | ICD-10-CM

## 2016-10-25 DIAGNOSIS — Z951 Presence of aortocoronary bypass graft: Secondary | ICD-10-CM

## 2016-10-25 DIAGNOSIS — Z7982 Long term (current) use of aspirin: Secondary | ICD-10-CM | POA: Diagnosis not present

## 2016-10-25 DIAGNOSIS — Z8673 Personal history of transient ischemic attack (TIA), and cerebral infarction without residual deficits: Secondary | ICD-10-CM

## 2016-10-25 DIAGNOSIS — E1122 Type 2 diabetes mellitus with diabetic chronic kidney disease: Secondary | ICD-10-CM | POA: Diagnosis present

## 2016-10-25 DIAGNOSIS — Z87891 Personal history of nicotine dependence: Secondary | ICD-10-CM | POA: Diagnosis not present

## 2016-10-25 DIAGNOSIS — G300 Alzheimer's disease with early onset: Secondary | ICD-10-CM | POA: Diagnosis present

## 2016-10-25 DIAGNOSIS — Z794 Long term (current) use of insulin: Secondary | ICD-10-CM

## 2016-10-25 DIAGNOSIS — K625 Hemorrhage of anus and rectum: Principal | ICD-10-CM | POA: Diagnosis present

## 2016-10-25 DIAGNOSIS — Z888 Allergy status to other drugs, medicaments and biological substances status: Secondary | ICD-10-CM | POA: Diagnosis not present

## 2016-10-25 DIAGNOSIS — Z955 Presence of coronary angioplasty implant and graft: Secondary | ICD-10-CM

## 2016-10-25 DIAGNOSIS — Z9889 Other specified postprocedural states: Secondary | ICD-10-CM

## 2016-10-25 DIAGNOSIS — I129 Hypertensive chronic kidney disease with stage 1 through stage 4 chronic kidney disease, or unspecified chronic kidney disease: Secondary | ICD-10-CM | POA: Diagnosis present

## 2016-10-25 DIAGNOSIS — K640 First degree hemorrhoids: Secondary | ICD-10-CM | POA: Diagnosis present

## 2016-10-25 DIAGNOSIS — K922 Gastrointestinal hemorrhage, unspecified: Secondary | ICD-10-CM | POA: Diagnosis present

## 2016-10-25 DIAGNOSIS — G20 Parkinson's disease: Secondary | ICD-10-CM | POA: Diagnosis present

## 2016-10-25 DIAGNOSIS — K5730 Diverticulosis of large intestine without perforation or abscess without bleeding: Secondary | ICD-10-CM | POA: Diagnosis present

## 2016-10-25 DIAGNOSIS — F0280 Dementia in other diseases classified elsewhere without behavioral disturbance: Secondary | ICD-10-CM | POA: Diagnosis present

## 2016-10-25 LAB — BASIC METABOLIC PANEL
ANION GAP: 9 (ref 5–15)
BUN: 28 mg/dL — ABNORMAL HIGH (ref 6–20)
CALCIUM: 9.1 mg/dL (ref 8.9–10.3)
CO2: 24 mmol/L (ref 22–32)
Chloride: 101 mmol/L (ref 101–111)
Creatinine, Ser: 1.58 mg/dL — ABNORMAL HIGH (ref 0.61–1.24)
GFR, EST AFRICAN AMERICAN: 46 mL/min — AB (ref >60)
GFR, EST NON AFRICAN AMERICAN: 39 mL/min — AB (ref >60)
GLUCOSE: 147 mg/dL — AB (ref 65–99)
POTASSIUM: 4.3 mmol/L (ref 3.5–5.1)
Sodium: 134 mmol/L — ABNORMAL LOW (ref 135–145)

## 2016-10-25 LAB — HEMOGLOBIN AND HEMATOCRIT, BLOOD
HCT: 34.3 % — ABNORMAL LOW (ref 40.0–52.0)
HEMATOCRIT: 33.3 % — AB (ref 40.0–52.0)
HEMOGLOBIN: 12.2 g/dL — AB (ref 13.0–18.0)
Hemoglobin: 11.8 g/dL — ABNORMAL LOW (ref 13.0–18.0)

## 2016-10-25 LAB — CBC
HEMATOCRIT: 34.9 % — AB (ref 40.0–52.0)
Hemoglobin: 12.3 g/dL — ABNORMAL LOW (ref 13.0–18.0)
MCH: 32.9 pg (ref 26.0–34.0)
MCHC: 35.3 g/dL (ref 32.0–36.0)
MCV: 93.3 fL (ref 80.0–100.0)
PLATELETS: 156 10*3/uL (ref 150–440)
RBC: 3.74 MIL/uL — AB (ref 4.40–5.90)
RDW: 13.7 % (ref 11.5–14.5)
WBC: 5.9 10*3/uL (ref 3.8–10.6)

## 2016-10-25 LAB — PROTIME-INR
INR: 1.04
PROTHROMBIN TIME: 13.6 s (ref 11.4–15.2)

## 2016-10-25 LAB — TYPE AND SCREEN
ABO/RH(D): A POS
ANTIBODY SCREEN: NEGATIVE

## 2016-10-25 LAB — GLUCOSE, CAPILLARY
GLUCOSE-CAPILLARY: 98 mg/dL (ref 65–99)
GLUCOSE-CAPILLARY: 107 mg/dL — AB (ref 65–99)

## 2016-10-25 MED ORDER — ACETAMINOPHEN 325 MG PO TABS: 650.0000 mg | ORAL_TABLET | Freq: Four times a day (QID) | ORAL | Status: DC | PRN

## 2016-10-25 MED ORDER — FAMOTIDINE IN NACL 20-0.9 MG/50ML-% IV SOLN
20.0000 mg | Freq: Two times a day (BID) | INTRAVENOUS | Status: DC
  Administered 2016-10-25 – 2016-10-28 (×7): 20 mg via INTRAVENOUS
  Filled 2016-10-25 (×8): qty 50

## 2016-10-25 MED ORDER — DOCUSATE SODIUM 100 MG PO CAPS
100.0000 mg | ORAL_CAPSULE | Freq: Two times a day (BID) | ORAL | Status: DC
  Administered 2016-10-25 – 2016-10-28 (×6): 100 mg via ORAL
  Filled 2016-10-25 (×6): qty 1

## 2016-10-25 MED ORDER — INSULIN ASPART 100 UNIT/ML ~~LOC~~ SOLN: 0.0000 [IU] | Freq: Every day | SUBCUTANEOUS | Status: DC

## 2016-10-25 MED ORDER — INSULIN ASPART 100 UNIT/ML ~~LOC~~ SOLN: 0.0000 [IU] | Freq: Three times a day (TID) | SUBCUTANEOUS | Status: DC

## 2016-10-25 MED ORDER — POLYETHYLENE GLYCOL 3350 17 G PO PACK: 17.0000 g | PACK | Freq: Every day | ORAL | Status: DC | PRN

## 2016-10-25 MED ORDER — INSULIN ASPART 100 UNIT/ML ~~LOC~~ SOLN: 0.0000 [IU] | Freq: Four times a day (QID) | SUBCUTANEOUS | Status: DC

## 2016-10-25 MED ORDER — CARBIDOPA-LEVODOPA 25-100 MG PO TABS
1.0000 | ORAL_TABLET | Freq: Three times a day (TID) | ORAL | Status: DC
  Administered 2016-10-25 – 2016-10-28 (×8): 1 via ORAL
  Filled 2016-10-25 (×8): qty 1

## 2016-10-25 MED ORDER — DONEPEZIL HCL 5 MG PO TABS: 5.0000 mg | ORAL_TABLET | Freq: Every day | ORAL | Status: DC

## 2016-10-25 MED ORDER — PRAVASTATIN SODIUM 10 MG PO TABS
10.0000 mg | ORAL_TABLET | Freq: Every day | ORAL | Status: DC
Start: 2016-10-25 — End: 2016-10-28
  Administered 2016-10-25 – 2016-10-27 (×3): 10 mg via ORAL
  Filled 2016-10-25 (×3): qty 1

## 2016-10-25 MED ORDER — SERTRALINE HCL 50 MG PO TABS
50.0000 mg | ORAL_TABLET | Freq: Every day | ORAL | Status: DC
  Administered 2016-10-25 – 2016-10-28 (×4): 50 mg via ORAL
  Filled 2016-10-25 (×4): qty 1

## 2016-10-25 MED ORDER — SODIUM CHLORIDE 0.9 % IV SOLN
INTRAVENOUS | Status: DC
  Administered 2016-10-25: 16:00:00 via INTRAVENOUS

## 2016-10-25 MED ORDER — VITAMIN B-12 1000 MCG PO TABS
1000.0000 ug | ORAL_TABLET | Freq: Every day | ORAL | Status: DC
  Administered 2016-10-25 – 2016-10-28 (×4): 1000 ug via ORAL
  Filled 2016-10-25 (×4): qty 1

## 2016-10-25 MED ORDER — ACETAMINOPHEN 650 MG RE SUPP: 650.0000 mg | Freq: Four times a day (QID) | RECTAL | Status: DC | PRN

## 2016-10-25 MED ORDER — FLUTICASONE PROPIONATE 50 MCG/ACT NA SUSP
2.0000 | Freq: Every day | NASAL | Status: DC
  Administered 2016-10-25 – 2016-10-28 (×4): 2 via NASAL
  Filled 2016-10-25: qty 16

## 2016-10-25 MED ORDER — LOSARTAN POTASSIUM 50 MG PO TABS
100.0000 mg | ORAL_TABLET | Freq: Every day | ORAL | Status: DC
  Administered 2016-10-25 – 2016-10-28 (×4): 100 mg via ORAL
  Filled 2016-10-25 (×4): qty 2

## 2016-10-25 MED ORDER — DONEPEZIL HCL 5 MG PO TABS
5.0000 mg | ORAL_TABLET | Freq: Every day | ORAL | Status: DC
  Administered 2016-10-25 – 2016-10-27 (×3): 5 mg via ORAL
  Filled 2016-10-25 (×3): qty 1

## 2016-10-25 MED ORDER — ONDANSETRON HCL 4 MG/2ML IJ SOLN
4.0000 mg | Freq: Four times a day (QID) | INTRAMUSCULAR | Status: DC | PRN
  Administered 2016-10-25 – 2016-10-27 (×3): 4 mg via INTRAVENOUS
  Filled 2016-10-25 (×3): qty 2

## 2016-10-25 MED ORDER — ONDANSETRON HCL 4 MG PO TABS: 4.0000 mg | ORAL_TABLET | Freq: Four times a day (QID) | ORAL | Status: DC | PRN

## 2016-10-25 MED ORDER — SODIUM CHLORIDE 0.9 % IV BOLUS (SEPSIS)
1000.0000 mL | Freq: Once | INTRAVENOUS | Status: AC
  Administered 2016-10-25: 1000 mL via INTRAVENOUS

## 2016-10-25 MED ORDER — BUSPIRONE HCL 5 MG PO TABS
7.5000 mg | ORAL_TABLET | Freq: Two times a day (BID) | ORAL | Status: DC
  Administered 2016-10-25 – 2016-10-28 (×6): 7.5 mg via ORAL
  Filled 2016-10-25 (×6): qty 2

## 2016-10-25 NOTE — H&P (Addendum)
[facility] [place] [facility] [place] NAME: [name]    MR#:  [id number]  DATE OF BIRTH:  07/09/1933  DATE OF ADMISSION:  10/25/2016  PRIMARY CARE PHYSICIAN: Adin Hector, MD   REQUESTING/REFERRING PHYSICIAN: Harvest Dark, MD  CHIEF COMPLAINT:  Rectal bleeding  HISTORY OF PRESENT ILLNESS:  [name]  is a 82 y.o. male with a known history of Asthma, coronary artery disease status post left circumflex stent, chronic kidney disease stage III, COPD and early onset Alzheimer's dementia is presenting to the ED with a chief complaint of rectal bleed. Patient is a poor historian given the history of dementia. According to patient's wife  who is at bedside patient has bright red blood per rectum 2 yesterday morning and another 2 episodes today a.m.Patient seemed to be more confused today according to the wife Patient had history of hemorrhoidectomy in the past and seen by Dr. Vira Agar in the past. Wife is not quite sure whether he had any EGD or colonoscopy. Patient denies any hematemesis or abdominal pain,  PAST MEDICAL HISTORY:   Past Medical History:  Diagnosis Date  . Asthma   . CAD (coronary artery disease)    Status post left circumflex stent  . CKD (chronic kidney disease) stage 3, GFR 30-59 ml/min   . COPD (chronic obstructive pulmonary disease) ([place])   . 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 Diabetes mellitus   . Hypertension   . Myocardial infarction   . OSA (obstructive sleep apnea)   . Parkinson's disease ([place])   . Shortness of breath   . Stroke [facility])     PAST SURGICAL HISTOIRY:   Past Surgical History:  Procedure Laterality Date  . CARDIAC CATHETERIZATION    . CORONARY ARTERY BYPASS GRAFT    . EYE SURGERY    . NASAL SINUS SURGERY      SOCIAL HISTORY:   Social History  Substance Use Topics  . Smoking status: Former Smoker    Packs/day: 2.00    Years: 50.00    Types: Cigarettes    Quit date:  12/18/2005  . Smokeless tobacco: Never Used     Comment: Quit about 12 years ago  . Alcohol use No    FAMILY HISTORY:   Family History  Problem Relation Age of Onset  . Heart disease Father   . Cancer Father     DRUG ALLERGIES:   Allergies  Allergen Reactions  . Lipitor [Atorvastatin] Rash    myalgia    REVIEW OF SYSTEMS:  Review of systems unobtainable because of the altered mental status   MEDICATIONS AT HOME:   Prior to Admission medications   Medication Sig Start Date End Date Taking? Authorizing Provider  amLODipine (NORVASC) 10 MG tablet take 1 tablet by mouth daily 02/03/16  Yes Historical Provider, MD  aspirin EC 81 MG tablet Take 81 mg by mouth at bedtime.    Yes Historical Provider, MD  busPIRone (BUSPAR) 15 MG tablet Take 7.5 mg by mouth 2 (two) times daily as needed.   Yes Historical Provider, MD  carbidopa-levodopa (SINEMET IR) 25-100 MG tablet Take 1 tablet by mouth 3 (three) times daily.   Yes Historical Provider, MD  cyanocobalamin 500 MCG tablet Take 1,000 mcg by mouth daily.    Yes Historical Provider, MD  docusate sodium (COLACE) 100 MG capsule Take 1 capsule (100 mg total) by mouth 2 (two) times daily. 10/18/16  Yes Theodoro Grist, MD  donepezil (ARICEPT) 5 MG tablet Take 5 mg by mouth daily.   Yes Historical Provider, MD  fluticasone (FLONASE) 50 MCG/ACT nasal spray Place 2 sprays into the nose as needed.    Yes Historical Provider, MD  losartan (COZAAR) 100 MG tablet Take 100 mg by mouth daily.    Yes Historical Provider, MD  lovastatin (MEVACOR) 40 MG tablet Take 40 mg by mouth at bedtime.    Yes Historical Provider, MD  meloxicam (MOBIC) 15 MG tablet take 1 tablet by mouth daily as needed 01/18/16  Yes Historical Provider, MD  metoprolol succinate (TOPROL-XL) 25 MG 24 hr tablet Take 1 tablet (25 mg total) by mouth daily. Patient taking differently: Take 12.5 mg by mouth daily.  10/18/16  Yes Theodoro Grist, MD  Multiple Vitamin (MULTIVITAMIN WITH  MINERALS) TABS tablet Take 1 tablet by mouth daily.   Yes Historical Provider, MD  omeprazole (PRILOSEC) 20 MG capsule Take 20 mg by mouth daily.   Yes Historical Provider, MD  ondansetron (ZOFRAN) 4 MG tablet Take 1-2 tabs by mouth every 8 hours as needed for nausea/vomiting 12/19/15  Yes Hinda Kehr, MD  sertraline (ZOLOFT) 50 MG tablet Take 50 mg by mouth daily.   Yes Historical Provider, MD  Blood Glucose Monitoring Suppl (ONE TOUCH ULTRA 2) w/Device KIT FPD 11/22/15   Historical Provider, MD  ONE TOUCH ULTRA TEST test strip USE TO TEST BLOOD SUGAR QD 11/22/15   Historical Provider, MD  Glory Rosebush DELICA LANCETS 11B MISC USE TO TEST BLOOD SUGAR QD 11/22/15   Historical Provider, MD  polyethylene glycol (MIRALAX / GLYCOLAX) packet Take 17 g by mouth daily as needed for mild constipation. Patient not taking: Reported on 10/25/2016 10/18/16   Theodoro Grist, MD  PREVNAR 13 SUSP injection ADM 0.5 ML IM 1 TIME FOR 1 DOSE 11/22/15   Historical Provider, MD      VITAL SIGNS:  Blood pressure (!) 147/58, pulse (!) 56, temperature 97.4 F (36.3 C), temperature source Oral, resp. rate 12, height 6' (1.829 m), weight 81.6 kg (180 lb), SpO2 100 %.  PHYSICAL EXAMINATION:  GENERAL:  82 y.o.-year-old patient lying in the bed with no acute distress.  EYES: Pupils equal, round, reactive to light and accommodation. No scleral icterus. Extraocular muscles intact.  HEENT: Head atraumatic, normocephalic. Oropharynx and nasopharynx clear.  NECK:  Supple, no jugular venous distention. No thyroid enlargement, no tenderness.  LUNGS: Normal breath sounds bilaterally, no wheezing, rales,rhonchi or crepitation. No use of accessory muscles of respiration.  CARDIOVASCULAR: S1, S2 normal. No murmurs, rubs, or gallops.  ABDOMEN: Soft, nontender, nondistended. Bowel sounds present.  EXTREMITIES: No pedal edema, cyanosis, or clubbing.  NEUROLOGIC: Patient is demented cranial nerves grossly intact. Muscle strength 5/5 in all  extremities. Sensation intact. Gait not checked.  PSYCHIATRIC: The patient is alert and oriented x 2 SKIN: No obvious rash, lesion, or ulcer.   LABORATORY PANEL:   CBC  Recent Labs Lab 10/25/16 1018 10/25/16 1518  WBC 5.9  --   HGB 12.3* 12.2*  HCT 34.9* 34.3*  PLT 156  --    ------------------------------------------------------------------------------------------------------------------  Chemistries   Recent Labs Lab 10/25/16 1018  NA 134*  K 4.3  CL 101  CO2 24  GLUCOSE 147*  BUN 28*  CREATININE 1.58*  CALCIUM 9.1   ------------------------------------------------------------------------------------------------------------------  Cardiac Enzymes No results for input(s): TROPONINI in the last 168 hours. ------------------------------------------------------------------------------------------------------------------  RADIOLOGY:  No results found.  EKG:   Orders placed or performed during the hospital encounter of  10/17/16  . ED EKG  . ED EKG  . EKG    IMPRESSION AND PLAN:   [name]  is a 82 y.o. male with a known history of Asthma, coronary artery disease status post left circumflex stent, chronic kidney disease stage III, COPD and early onset Alzheimer's dementia is presenting to the ED with a chief complaint of rectal bleed. Patient is a poor historian given the history of dementia. According to patient's wife who is at bedside patient has bright red blood per rectum 2 yesterday morning and another 2 episodes today a.m. Patient had history of hemorrhoidectomy in the past and seen by Dr. Vira Agar in the past.  #Lower GI bleed could be from hemorrhoids  Admit to MedSurg unit Hemoglobin stable. Patient is hemodynamically stable at this time Monitor hemoglobin and crit closely GI consult is placed to Dr. Vira Agar Clear liquid diet Protonix Discontinued home medication Mobic   #diabetes mellitus diet controlled   sensitive sliding scale  insulin Currently patient is on clear liquid diet  #History of coronary artery disease status post stent Holding aspirin continue statin  #Essential hypertension Blood pressure is soft holding metoprolol and amlodipine Continue Cozaar  #Sinus bradycardia symptomatic holding metoprolol monitor closely  DVT prophylaxis with SCDs    All  the records are reviewed and case discussed with ED provider. Management plans discussed with the patient, And wife  and they are in agreement.  CODE STATUS: Full code, wife is the healthcare power of attorney  TOTAL TIME TAKING CARE OF THIS PATIENT: 45 minutes.   Note: This dictation was prepared with Dragon dictation along with smaller phrase technology. Any transcriptional errors that result from this process are unintentional.  Nicholes Mango M.D on 10/25/2016 at 3:43 PM  Between 7am to 6pm - Pager - [phone]  After 6pm go to [email or web address] - password [email or web address] [facility]  [facility]  Office  [phone]  CC: Primary care physician; Adin Hector, MD

## 2016-10-25 NOTE — ED Provider Notes (Signed)
[facility] Emergency Department Provider Note  Time seen: 10:20 AM  I have reviewed the triage vital signs and the nursing notes.   HISTORY  Chief Complaint Rectal Bleeding    HPI [name] is a 82 y.o. male with a past medical history of dementia who presents to the emergency department with rectal bleeding. According to the wife the patient began with rectal bleeding yesterday. Patient continues with rectal bleeding today, the wife states this morning he had a bowel movement which appeared to be all blood so she brought him to the emergency department for evaluation. Patient denies any abdominal pain. Denies any history of rectal bleeding in the past. Takes an aspirin daily as only blood thinner.  Past Medical History:  Diagnosis Date  . Asthma   . CAD (coronary artery disease)    Status post left circumflex stent  . CKD (chronic kidney disease) stage 3, GFR 30-59 ml/min   . COPD (chronic obstructive pulmonary disease) ([place])   . 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 Diabetes mellitus   . Hypertension   . Myocardial infarction   . OSA (obstructive sleep apnea)   . Parkinson's disease ([place])   . Shortness of breath   . Stroke [facility])     Patient Active Problem List   Diagnosis Date Noted  . Hyponatremia 10/18/2016  . Bradycardia 10/18/2016  . Malignant essential hypertension 10/18/2016  . CKD (chronic kidney disease), stage III 10/18/2016  . Constipation 10/18/2016  . Nausea & vomiting 10/17/2016  . Absolute anemia 02/18/2016  . Major depression in remission ([place]) 11/20/2015  . Chronic kidney disease (CKD), stage III (moderate) 01/25/2015  . Parkinson's disease ([place]) 01/25/2015  . Arteriosclerosis of coronary artery 07/28/2014  . History of carotid endarterectomy 07/28/2014  . DD (diverticular disease) 07/28/2014  . Calculus of kidney 07/28/2014  . Peripheral vascular disease ([place]) 07/28/2014  . Thrombocytopenia ([place])  07/28/2014  . Dementia 03/09/2014  . Obstructive apnea 03/09/2014  . Pure hypercholesterolemia 03/09/2014  . Type 2 diabetes mellitus ([place]) 03/09/2014  . COPD 06/06/2013  . Essential hypertension, benign 06/06/2013  . Chronic obstructive pulmonary disease ([place]) 06/06/2013  . Depression 05/16/2011  . Memory loss 05/16/2011  . Poor circulation 05/16/2011    Past Surgical History:  Procedure Laterality Date  . CARDIAC CATHETERIZATION    . CORONARY ARTERY BYPASS GRAFT    . EYE SURGERY    . NASAL SINUS SURGERY      Prior to Admission medications   Medication Sig Start Date End Date Taking? Authorizing Provider  amLODipine (NORVASC) 10 MG tablet take 1 tablet by mouth daily 02/03/16   Historical Provider, MD  aspirin EC 81 MG tablet Take 81 mg by mouth at bedtime.     Historical Provider, MD  Blood Glucose Monitoring Suppl (ONE TOUCH ULTRA 2) w/Device KIT FPD 11/22/15   Historical Provider, MD  busPIRone (BUSPAR) 15 MG tablet Take 7.5 mg by mouth 2 (two) times daily as needed.    Historical Provider, MD  carbidopa-levodopa (SINEMET IR) 25-100 MG tablet Take 1 tablet by mouth 3 (three) times daily.    Historical Provider, MD  cyanocobalamin 500 MCG tablet Take 1,000 mcg by mouth daily.     Historical Provider, MD  docusate sodium (COLACE) 100 MG capsule Take 1 capsule (100 mg total) by mouth 2 (two) times daily. 10/18/16   Theodoro Grist, MD  donepezil (ARICEPT) 5 MG tablet Take 5 mg by mouth daily.  Historical Provider, MD  fluticasone (FLONASE) 50 MCG/ACT nasal spray Place 2 sprays into the nose as needed.     Historical Provider, MD  losartan (COZAAR) 100 MG tablet Take 100 mg by mouth daily.     Historical Provider, MD  lovastatin (MEVACOR) 40 MG tablet Take 40 mg by mouth at bedtime.     Historical Provider, MD  meloxicam (MOBIC) 15 MG tablet take 1 tablet by mouth daily as needed 01/18/16   Historical Provider, MD  metoprolol succinate (TOPROL-XL) 25 MG 24 hr tablet Take 1 tablet (25  mg total) by mouth daily. 10/18/16   Theodoro Grist, MD  Multiple Vitamin (MULTIVITAMIN WITH MINERALS) TABS tablet Take 1 tablet by mouth daily.    Historical Provider, MD  omeprazole (PRILOSEC) 20 MG capsule Take 20 mg by mouth daily.    Historical Provider, MD  ondansetron (ZOFRAN) 4 MG tablet Take 1-2 tabs by mouth every 8 hours as needed for nausea/vomiting 12/19/15   Hinda Kehr, MD  ONE TOUCH ULTRA TEST test strip USE TO TEST BLOOD SUGAR QD 11/22/15   Historical Provider, MD  Glory Rosebush DELICA LANCETS 32P MISC USE TO TEST BLOOD SUGAR QD 11/22/15   Historical Provider, MD  polyethylene glycol (MIRALAX / GLYCOLAX) packet Take 17 g by mouth daily as needed for mild constipation. 10/18/16   Theodoro Grist, MD  PREVNAR 13 SUSP injection ADM 0.5 ML IM 1 TIME FOR 1 DOSE 11/22/15   Historical Provider, MD  sertraline (ZOLOFT) 50 MG tablet Take 50 mg by mouth daily.    Historical Provider, MD    Allergies  Allergen Reactions  . Lipitor [Atorvastatin] Rash    myalgia    Family History  Problem Relation Age of Onset  . Heart disease Father   . Cancer Father     Social History Social History  Substance Use Topics  . Smoking status: Former Smoker    Packs/day: 2.00    Years: 50.00    Types: Cigarettes    Quit date: 12/18/2005  . Smokeless tobacco: Never Used     Comment: Quit about 12 years ago  . Alcohol use No    Review of Systems Constitutional: Negative for fever Cardiovascular: Negative for chest pain. Respiratory: Negative for shortness of breath. Gastrointestinal: Negative for abdominal pain. Positive for rectal bleeding. Neurological: Negative for headache 10-point ROS otherwise negative.  ____________________________________________   PHYSICAL EXAM:  VITAL SIGNS: ED Triage Vitals  Enc Vitals Group     BP 10/25/16 1003 (!) 103/50     Pulse Rate 10/25/16 1003 (!) 52     Resp 10/25/16 1003 (!) 28     Temp 10/25/16 1003 97.7 F (36.5 C)     Temp Source 10/25/16 1003  Oral     SpO2 10/25/16 1003 98 %     Weight 10/25/16 1006 180 lb (81.6 kg)     Height 10/25/16 1006 6' (1.829 m)     Head Circumference --      Peak Flow --      Pain Score --      Pain Loc --      Pain Edu? --      Excl. in [place]? --     Constitutional: Alert and oriented. Well appearing and in no distress. Eyes: Normal exam ENT   Head: Normocephalic and atraumatic   Mouth/Throat: Mucous membranes are moist. Cardiovascular: Normal rate, regular rhythm. No murmur Respiratory: Normal respiratory effort without tachypnea nor retractions. Breath sounds are clear  Gastrointestinal:  Soft and nontender. No distention.   Musculoskeletal: Nontender with normal range of motion in all extremities.  Neurologic:  Normal speech and language. No gross focal neurologic deficits Skin:  Skin is warm, dry and intact.  Psychiatric: Mood and affect are normal.   ____________________________________________     INITIAL IMPRESSION / ASSESSMENT AND PLAN / ED COURSE  Pertinent labs & imaging results that were available during my care of the patient were reviewed by me and considered in my medical decision making (see chart for details).  Patient presents the emergency department for rectal bleeding. Nontender abdominal exam. Rectal exam shows gross bright red blood, strongly guaiac positive. We will check labs, IV hydrate. Anticipated admission to the hospital. Dr. Vira Agar currently covering GI.  Patient's labs show hemoglobin of 12. However given the patient's gross blood on rectal examination we'll admit to the hospital for further treatment and GI workup.  ____________________________________________   FINAL CLINICAL IMPRESSION(S) / ED DIAGNOSES  Rectal bleeding    Harvest Dark, MD 10/25/16 1206

## 2016-10-25 NOTE — Consult Note (Signed)
GI Inpatient Consult Note  Reason for Consult:LGI bleed   Attending Requesting Consult:Dr. Gouru  History of Present Illness: [name] is a 82 y.o. male with onset of obvious rectal bleeding with BRBPR twice yesterday and again early this morning.  Brought to hospital and admitted for LGI bleed.  Pt has not had a colonoscopy in over 10 years.  Past Medical History:  Past Medical History:  Diagnosis Date  . Asthma   . CAD (coronary artery disease)    Status post left circumflex stent  . CKD (chronic kidney disease) stage 3, GFR 30-59 ml/min   . COPD (chronic obstructive pulmonary disease) ([place])   . 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 Diabetes mellitus   . Hypertension   . Myocardial infarction   . OSA (obstructive sleep apnea)   . Parkinson's disease ([place])   . Shortness of breath   . Stroke [facility])     Problem List: Patient Active Problem List   Diagnosis Date Noted  . Lower GI bleed 10/25/2016  . Hyponatremia 10/18/2016  . Bradycardia 10/18/2016  . Malignant essential hypertension 10/18/2016  . CKD (chronic kidney disease), stage III 10/18/2016  . Constipation 10/18/2016  . Nausea & vomiting 10/17/2016  . Absolute anemia 02/18/2016  . Major depression in remission ([place]) 11/20/2015  . Chronic kidney disease (CKD), stage III (moderate) 01/25/2015  . Parkinson's disease ([place]) 01/25/2015  . Arteriosclerosis of coronary artery 07/28/2014  . History of carotid endarterectomy 07/28/2014  . DD (diverticular disease) 07/28/2014  . Calculus of kidney 07/28/2014  . Peripheral vascular disease ([place]) 07/28/2014  . Thrombocytopenia ([place]) 07/28/2014  . Dementia 03/09/2014  . Obstructive apnea 03/09/2014  . Pure hypercholesterolemia 03/09/2014  . Type 2 diabetes mellitus ([place]) 03/09/2014  . COPD 06/06/2013  . Essential hypertension, benign 06/06/2013  . Chronic obstructive pulmonary disease ([place]) 06/06/2013  . Depression 05/16/2011  . Memory loss 05/16/2011   . Poor circulation 05/16/2011    Past Surgical History: Past Surgical History:  Procedure Laterality Date  . CARDIAC CATHETERIZATION    . CORONARY ARTERY BYPASS GRAFT    . EYE SURGERY    . NASAL SINUS SURGERY      Allergies: Allergies  Allergen Reactions  . Lipitor [Atorvastatin] Rash    myalgia    Home Medications: Prescriptions Prior to Admission  Medication Sig Dispense Refill Last Dose  . amLODipine (NORVASC) 10 MG tablet take 1 tablet by mouth daily  3 10/24/2016 at Unknown time  . aspirin EC 81 MG tablet Take 81 mg by mouth at bedtime.    10/24/2016 at Unknown time  . busPIRone (BUSPAR) 15 MG tablet Take 7.5 mg by mouth 2 (two) times daily as needed.   10/24/2016 at Unknown time  . carbidopa-levodopa (SINEMET IR) 25-100 MG tablet Take 1 tablet by mouth 3 (three) times daily.   10/24/2016 at Unknown time  . cyanocobalamin 500 MCG tablet Take 1,000 mcg by mouth daily.    10/24/2016 at Unknown time  . docusate sodium (COLACE) 100 MG capsule Take 1 capsule (100 mg total) by mouth 2 (two) times daily. 10 capsule 0 10/25/2016 at Unknown time  . donepezil (ARICEPT) 5 MG tablet Take 5 mg by mouth daily.   10/24/2016 at Unknown time  . fluticasone (FLONASE) 50 MCG/ACT nasal spray Place 2 sprays into the nose as needed.    10/24/2016 at Unknown time  . losartan (COZAAR) 100 MG tablet Take 100 mg by mouth daily.  10/24/2016 at Unknown time  . lovastatin (MEVACOR) 40 MG tablet Take 40 mg by mouth at bedtime.    10/24/2016 at Unknown time  . meloxicam (MOBIC) 15 MG tablet take 1 tablet by mouth daily as needed  5 prn at prn  . metoprolol succinate (TOPROL-XL) 25 MG 24 hr tablet Take 1 tablet (25 mg total) by mouth daily. (Patient taking differently: Take 12.5 mg by mouth daily. ) 30 tablet 5 10/24/2016 at Unknown time  . Multiple Vitamin (MULTIVITAMIN WITH MINERALS) TABS tablet Take 1 tablet by mouth daily.   10/24/2016 at Unknown time  . omeprazole (PRILOSEC) 20 MG capsule Take 20 mg by mouth  daily.   10/24/2016 at Unknown time  . ondansetron (ZOFRAN) 4 MG tablet Take 1-2 tabs by mouth every 8 hours as needed for nausea/vomiting 30 tablet 0 prn at prn  . sertraline (ZOLOFT) 50 MG tablet Take 50 mg by mouth daily.   10/24/2016 at Unknown time  . Blood Glucose Monitoring Suppl (ONE TOUCH ULTRA 2) w/Device KIT FPD  0   . ONE TOUCH ULTRA TEST test strip USE TO TEST BLOOD SUGAR QD  0   . ONETOUCH DELICA LANCETS 97D MISC USE TO TEST BLOOD SUGAR QD  0   . polyethylene glycol (MIRALAX / GLYCOLAX) packet Take 17 g by mouth daily as needed for mild constipation. (Patient not taking: Reported on 10/25/2016) 14 each 0 Not Taking at Unknown time  . PREVNAR 13 SUSP injection ADM 0.5 ML IM 1 TIME FOR 1 DOSE  0    Home medication reconciliation was completed with the patient.   Scheduled Inpatient Medications:   . busPIRone  7.5 mg Oral BID  . carbidopa-levodopa  1 tablet Oral TID  . docusate sodium  100 mg Oral BID  . donepezil  5 mg Oral Daily  . famotidine (PEPCID) IV  20 mg Intravenous Q12H  . fluticasone  2 spray Each Nare Daily  . insulin aspart  0-5 Units Subcutaneous QHS  . insulin aspart  0-9 Units Subcutaneous TID WC  . losartan  100 mg Oral Daily  . pravastatin  10 mg Oral q1800  . sertraline  50 mg Oral Daily  . cyanocobalamin  1,000 mcg Oral Daily    Continuous Inpatient Infusions:   . sodium chloride 100 mL/hr at 10/25/16 1557    PRN Inpatient Medications:  acetaminophen **OR** acetaminophen, ondansetron **OR** ondansetron (ZOFRAN) IV, polyethylene glycol  Family History: family history includes Cancer in his father; Heart disease in his father.  The patient's family history is negative for inflammatory bowel disorders, GI malignancy, or solid organ transplantation.  Social History:   reports that he quit smoking about 10 years ago. His smoking use included Cigarettes. He has a 100.00 pack-year smoking history. He has never used smokeless tobacco. He reports that he does  not drink alcohol or use drugs. The patient denies ETOH, tobacco, or drug use.   Review of Systems: Constitutional: Weight is stable.  Eyes: No changes in vision. ENT: No oral lesions, sore throat.  GI: see HPI.  Heme/Lymph: No easy bruising.  CV: No chest pain.  GU: No hematuria.  Integumentary: No rashes.  Neuro: No headaches. Hx of Parkinson's disease Psych: No depression/anxiety.  Endocrine: No heat/cold intolerance.  Allergic/Immunologic: No urticaria.  Resp: No cough, SOB.  Musculoskeletal: No joint swelling.    Physical Examination: BP (!) 147/58 (BP Location: Left Arm)   Pulse (!) 56   Temp 97.4 F (36.3 C) (Oral)  Resp 12   Ht 6' (1.829 m)   Wt 81.6 kg (180 lb)   SpO2 100%   BMI 24.41 kg/m  Gen: NAD, alert and oriented x 4 HEENT: PEERLA, EOMI, Neck: supple, no JVD or thyromegaly Chest: CTA bilaterally, no wheezes, crackles, or other adventitious sounds CV: RRR, no m/g/c/r Abd: soft, NT, ND, +BS in all four quadrants; no HSM, some minimal tenderness in palpation of LLQ. Ext: no edema, Skin: no rash or lesions noted   Data: Lab Results  Component Value Date   WBC 5.9 10/25/2016   HGB 12.2 (L) 10/25/2016   HCT 34.3 (L) 10/25/2016   MCV 93.3 10/25/2016   PLT 156 10/25/2016    Recent Labs Lab 10/25/16 1018 10/25/16 1518  HGB 12.3* 12.2*   Lab Results  Component Value Date   NA 134 (L) 10/25/2016   K 4.3 10/25/2016   CL 101 10/25/2016   CO2 24 10/25/2016   BUN 28 (H) 10/25/2016   CREATININE 1.58 (H) 10/25/2016   Lab Results  Component Value Date   ALT 21 12/19/2015   AST 30 12/19/2015   ALKPHOS 77 12/19/2015   BILITOT 1.2 12/19/2015    Recent Labs Lab 10/25/16 1018  INR 1.04   Assessment/Plan: Mr. Mcclintock is a 82 y.o. male with BRBPR with last bleeding this morning, about 11-12 hours ago. He did have mild tenderness to palpation.  No colonoscopy in several years.  Appears bleeding has stopped since no bleeding in 11 hours and hgb  stable.  Recommendations:Observe for now, follow VS and hgb as you are doing.  He may be a candidate for colonoscopy but he has some degree of Alzheimer;s disease and may be hard to get to drink the prep.  Will follow with you.  Thank you for the consult. Please call with questions or concerns.  Gaylyn Cheers, MD

## 2016-10-25 NOTE — ED Triage Notes (Signed)
Pt to ed with c/o rectal bleeding x 2 days.  [facility].  States this am rectal bleeding continues.

## 2016-10-25 NOTE — Progress Notes (Signed)
Family Meeting Note  Advance Directive:yes  Today a meeting took place with the Patient and spouse at bed side  Patient is unable to participate due WU:[id number]:Lacked capacity -demented  The following clinical team members were present during this meeting:MD  The following were discussed:Patient's diagnosis: , Patient's progosis: > 12 months and Goals for treatment: Full Code, not considering palliative care at this time. Wife is the healthcare power of attorney  Additional follow-up to be provided: Hospitalist team and gastroenterology  Time spent during discussion:16 min   Lorrayne Ismael, Deanna ArtisAruna, MD

## 2016-10-26 LAB — COMPREHENSIVE METABOLIC PANEL
ALBUMIN: 3.6 g/dL (ref 3.5–5.0)
ALT: 7 U/L — ABNORMAL LOW (ref 17–63)
ANION GAP: 8 (ref 5–15)
AST: 31 U/L (ref 15–41)
Alkaline Phosphatase: 70 U/L (ref 38–126)
BUN: 19 mg/dL (ref 6–20)
CALCIUM: 8.8 mg/dL — AB (ref 8.9–10.3)
CHLORIDE: 104 mmol/L (ref 101–111)
CO2: 23 mmol/L (ref 22–32)
Creatinine, Ser: 1.38 mg/dL — ABNORMAL HIGH (ref 0.61–1.24)
GFR calc non Af Amer: 46 mL/min — ABNORMAL LOW (ref >60)
GFR, EST AFRICAN AMERICAN: 54 mL/min — AB (ref >60)
Glucose, Bld: 121 mg/dL — ABNORMAL HIGH (ref 65–99)
POTASSIUM: 4.1 mmol/L (ref 3.5–5.1)
SODIUM: 135 mmol/L (ref 135–145)
Total Bilirubin: 1.1 mg/dL (ref 0.3–1.2)
Total Protein: 6.6 g/dL (ref 6.5–8.1)

## 2016-10-26 LAB — CBC
HEMATOCRIT: 35.5 % — AB (ref 40.0–52.0)
HEMOGLOBIN: 12.5 g/dL — AB (ref 13.0–18.0)
MCH: 32.5 pg (ref 26.0–34.0)
MCHC: 35.2 g/dL (ref 32.0–36.0)
MCV: 92.4 fL (ref 80.0–100.0)
Platelets: 150 10*3/uL (ref 150–440)
RBC: 3.84 MIL/uL — AB (ref 4.40–5.90)
RDW: 13.3 % (ref 11.5–14.5)
WBC: 5.1 10*3/uL (ref 3.8–10.6)

## 2016-10-26 LAB — GLUCOSE, CAPILLARY
GLUCOSE-CAPILLARY: 106 mg/dL — AB (ref 65–99)
GLUCOSE-CAPILLARY: 95 mg/dL (ref 65–99)
Glucose-Capillary: 111 mg/dL — ABNORMAL HIGH (ref 65–99)
Glucose-Capillary: 85 mg/dL (ref 65–99)
Glucose-Capillary: 110 mg/dL — ABNORMAL HIGH (ref 65–99)

## 2016-10-26 LAB — PROTIME-INR
INR: 1.04
PROTHROMBIN TIME: 13.6 s (ref 11.4–15.2)

## 2016-10-26 MED ORDER — INSULIN ASPART 100 UNIT/ML ~~LOC~~ SOLN: 0.0000 [IU] | Freq: Every day | SUBCUTANEOUS | Status: DC

## 2016-10-26 MED ORDER — SODIUM CHLORIDE 0.9 % IV SOLN
INTRAVENOUS | Status: DC
  Administered 2016-10-27: 10 mL/h via INTRAVENOUS

## 2016-10-26 MED ORDER — INSULIN ASPART 100 UNIT/ML ~~LOC~~ SOLN: 0.0000 [IU] | Freq: Three times a day (TID) | SUBCUTANEOUS | Status: DC

## 2016-10-26 MED ORDER — PEG 3350-KCL-NA BICARB-NACL 420 G PO SOLR
4000.0000 mL | Freq: Once | ORAL | Status: AC
  Administered 2016-10-27: 4000 mL via ORAL
  Filled 2016-10-26: qty 4000

## 2016-10-26 NOTE — Consult Note (Signed)
Patient hgb stable, passed a small amt of blood.  No abd pain, no tenderness.  VSS afebrile.  I talked to his wife about colonoscopy here or later as outpatient, she has no one to help her with prep so will do this tomorrow afternoon after morning prep tomorrow.

## 2016-10-27 ENCOUNTER — Encounter: Payer: Self-pay | Admitting: *Deleted

## 2016-10-27 ENCOUNTER — Inpatient Hospital Stay: Payer: Medicare Other | Admitting: Anesthesiology

## 2016-10-27 ENCOUNTER — Encounter
Admission: EM | Disposition: A | Discharge status: Home / Self Care | Payer: Self-pay | Source: Home / Self Care | Attending: Internal Medicine

## 2016-10-27 HISTORY — PX: COLONOSCOPY WITH PROPOFOL: SHX5780

## 2016-10-27 LAB — GLUCOSE, CAPILLARY
GLUCOSE-CAPILLARY: 105 mg/dL — AB (ref 65–99)
GLUCOSE-CAPILLARY: 100 mg/dL — AB (ref 65–99)
GLUCOSE-CAPILLARY: 93 mg/dL (ref 65–99)
GLUCOSE-CAPILLARY: 109 mg/dL — AB (ref 65–99)

## 2016-10-27 LAB — HEMOGLOBIN A1C
Hgb A1c MFr Bld: 5.5 % (ref 4.8–5.6)
MEAN PLASMA GLUCOSE: 111 mg/dL

## 2016-10-27 LAB — CBC
HCT: 35.2 % — ABNORMAL LOW (ref 40.0–52.0)
HEMOGLOBIN: 12.4 g/dL — AB (ref 13.0–18.0)
MCH: 32.6 pg (ref 26.0–34.0)
MCHC: 35.3 g/dL (ref 32.0–36.0)
MCV: 92.3 fL (ref 80.0–100.0)
Platelets: 142 10*3/uL — ABNORMAL LOW (ref 150–440)
RBC: 3.81 MIL/uL — AB (ref 4.40–5.90)
RDW: 13.8 % (ref 11.5–14.5)
WBC: 4.3 10*3/uL (ref 3.8–10.6)

## 2016-10-27 SURGERY — COLONOSCOPY WITH PROPOFOL
Anesthesia: General

## 2016-10-27 MED ORDER — AMLODIPINE BESYLATE 10 MG PO TABS
10.0000 mg | ORAL_TABLET | Freq: Every day | ORAL | Status: DC
  Administered 2016-10-27: 10 mg via ORAL
  Filled 2016-10-27 (×2): qty 1

## 2016-10-27 MED ORDER — PHENYLEPHRINE HCL 10 MG/ML IJ SOLN
INTRAMUSCULAR | Status: DC | PRN
  Administered 2016-10-27: 100 ug via INTRAVENOUS

## 2016-10-27 MED ORDER — PROPOFOL 500 MG/50ML IV EMUL
INTRAVENOUS | Status: DC | PRN
  Administered 2016-10-27: 100 ug/kg/min via INTRAVENOUS

## 2016-10-27 MED ORDER — LIDOCAINE HCL (CARDIAC) 20 MG/ML IV SOLN
INTRAVENOUS | Status: DC | PRN
  Administered 2016-10-27: 60 mg via INTRAVENOUS

## 2016-10-27 MED ORDER — PROPOFOL 10 MG/ML IV BOLUS
INTRAVENOUS | Status: DC | PRN
  Administered 2016-10-27: 40 mg via INTRAVENOUS
  Administered 2016-10-27: 20 mg via INTRAVENOUS
  Administered 2016-10-27: 10 mg via INTRAVENOUS

## 2016-10-27 MED ORDER — PROPOFOL 500 MG/50ML IV EMUL: INTRAVENOUS | Status: DC | PRN

## 2016-10-27 MED ORDER — LIDOCAINE 2% (20 MG/ML) 5 ML SYRINGE: INTRAMUSCULAR | Status: DC | PRN

## 2016-10-27 NOTE — Progress Notes (Signed)
[facility] [place] [facility]   PATIENT NAME: [name]    MRN#:  [id number]  DATE OF BIRTH:  01/12/1933  SUBJECTIVE:  Hospital Day: 2 days [name] is a 82 y.o. male presenting with Rectal Bleeding .   Overnight events: no overnight events Interval Events: small BM this morning, scant blood  REVIEW OF SYSTEMS:  CONSTITUTIONAL: No fever, fatigue or weakness.  EYES: No blurred or double vision.  EARS, NOSE, AND THROAT: No tinnitus or ear pain.  RESPIRATORY: No cough, shortness of breath, wheezing or hemoptysis.  CARDIOVASCULAR: No chest pain, orthopnea, edema.  GASTROINTESTINAL: No nausea, vomiting, diarrhea or abdominal pain.  GENITOURINARY: No dysuria, hematuria.  ENDOCRINE: No polyuria, nocturia,  HEMATOLOGY: No anemia, easy bruising or bleeding SKIN: No rash or lesion. MUSCULOSKELETAL: No joint pain or arthritis.   NEUROLOGIC: No tingling, numbness, weakness.  PSYCHIATRY: No anxiety or depression.   DRUG ALLERGIES:   Allergies  Allergen Reactions  . Lipitor [Atorvastatin] Rash    myalgia    VITALS:  Blood pressure (!) 165/91, pulse 67, temperature 97.7 F (36.5 C), temperature source Oral, resp. rate 16, height 6' (1.829 m), weight 81.6 kg (180 lb), SpO2 94 %.  PHYSICAL EXAMINATION:  VITAL SIGNS: Vitals:   10/27/16 0544 10/27/16 1027  BP: (!) 155/59 (!) 165/91  Pulse: (!) 56 67  Resp: 16   Temp: 97.7 F (36.5 C)    GENERAL:83 y.o.male currently in no acute distress.  HEAD: Normocephalic, atraumatic.  EYES: Pupils equal, round, reactive to light. Extraocular muscles intact. No scleral icterus.  MOUTH: Moist mucosal membrane. Dentition intact. No abscess noted.  EAR, NOSE, THROAT: Clear without exudates. No external lesions.  NECK: Supple. No thyromegaly. No nodules. No JVD.  PULMONARY: Clear to ascultation, without wheeze rails or rhonci. No use of accessory muscles, Good respiratory effort. good air entry  bilaterally CHEST: Nontender to palpation.  CARDIOVASCULAR: S1 and S2. Regular rate and rhythm. No murmurs, rubs, or gallops. No edema. Pedal pulses 2+ bilaterally.  GASTROINTESTINAL: Soft, nontender, nondistended. No masses. Positive bowel sounds. No hepatosplenomegaly.  MUSCULOSKELETAL: No swelling, clubbing, or edema. Range of motion full in all extremities.  NEUROLOGIC: Cranial nerves II through XII are intact. No gross focal neurological deficits. Sensation intact. Reflexes intact.  SKIN: No ulceration, lesions, rashes, or cyanosis. Skin warm and dry. Turgor intact.  PSYCHIATRIC: Mood, affect within normal limits. The patient is awake, alert and oriented x 3. Insight, judgment intact.      LABORATORY PANEL:   CBC  Recent Labs Lab 10/27/16 0751  WBC 4.3  HGB 12.4*  HCT 35.2*  PLT 142*   ------------------------------------------------------------------------------------------------------------------  Chemistries   Recent Labs Lab 10/26/16 0327  NA 135  K 4.1  CL 104  CO2 23  GLUCOSE 121*  BUN 19  CREATININE 1.38*  CALCIUM 8.8*  AST 31  ALT 7*  ALKPHOS 70  BILITOT 1.1   ------------------------------------------------------------------------------------------------------------------  Cardiac Enzymes No results for input(s): TROPONINI in the last 168 hours. ------------------------------------------------------------------------------------------------------------------  RADIOLOGY:  No results found.  EKG:   Orders placed or performed during the hospital encounter of 10/17/16  . ED EKG  . ED EKG  . EKG    ASSESSMENT AND PLAN:   [name] is a 82 y.o. male presenting with Rectal Bleeding . Admitted 10/25/2016 : Day #: 2 days 1. BRBPR: gi eval, for c-scope, hgb stable 2. Dm2, non insulin requiring: stable, ISS 3. CAD: asa on hold 4. htn essential: restart  meds as bp tolerates   All the records are reviewed and case discussed with Care  Management/Social Workerr. Management plans discussed with the patient, family and they are in agreement.  CODE STATUS: full TOTAL TIME TAKING CARE OF THIS PATIENT: 28 minutes.   POSSIBLE D/C IN 1-2DAYS, DEPENDING ON CLINICAL CONDITION.   Daiki Dicostanzo,  Mardi MainlandDavid K M.D on 10/27/2016 at 12:29 PM  Between 7am to 6pm - Pager - [phone]  After 6pm: House Pager: - [phone]  [facility]  Office  [phone]  CC: Primary care physician; Lynnea FerrierBERT J KLEIN III, MD

## 2016-10-27 NOTE — Anesthesia Postprocedure Evaluation (Signed)
Anesthesia Post Note  Patient: Raymond SandiferJames E Garrett  Procedure(s) Performed: Procedure(s) (LRB): COLONOSCOPY WITH PROPOFOL (N/A)  Patient location during evaluation: Endoscopy Anesthesia Type: General Level of consciousness: awake and alert Pain management: pain level controlled Vital Signs Assessment: post-procedure vital signs reviewed and stable Respiratory status: spontaneous breathing, nonlabored ventilation, respiratory function stable and patient connected to nasal cannula oxygen Cardiovascular status: blood pressure returned to baseline and stable Postop Assessment: no signs of nausea or vomiting Anesthetic complications: no    Last Vitals:  Vitals:   10/27/16 1647 10/27/16 1657  BP: (!) 167/70 (!) 162/67  Pulse: (!) 57 (!) 56  Resp: 18 18  Temp:      Last Pain:  Vitals:   10/27/16 1627  TempSrc: Tympanic  PainSc: Asleep                 Cleda MccreedyJoseph K Deagan Sevin

## 2016-10-27 NOTE — Progress Notes (Signed)
Pt commented he was making progress in recovery and was in good spirits. Wife present in room and she requested prayer. CH offered prayer.   10/27/16 1015  Clinical Encounter Type  Visited With Patient and family together  Visit Type Initial;Spiritual support  Referral From Nurse  Spiritual Encounters  Spiritual Needs Prayer;Emotional  Stress Factors  Patient Stress Factors Health changes  Family Stress Factors None identified

## 2016-10-27 NOTE — Progress Notes (Signed)
Per Dr. Mechele CollinElliott okay for pt to take his oral meds even though he is NPO for his colonscopy.

## 2016-10-27 NOTE — Care Management (Signed)
Patient admitted from home with Gi bleed.  Plan for colonoscopy today.  Patient with history of dementia and parkinson's.  Patient lives at home with wife.  PCP Daniel NonesBert Klein.  Pharmacy [facility].  Per wife at baseline patient does not require any medical assistive at home.  RW and cane availabe in the home if needed. Wife provides transportation. Denies any issues obtaining medications. No RNCM needs identified.

## 2016-10-27 NOTE — Progress Notes (Signed)
Pt has finished drinking his prep for coloscopy

## 2016-10-27 NOTE — Care Management Important Message (Signed)
Important Message  Patient Details  Name: Raymond RoeJames E Crosby MRN: [id number] Date of Birth: 07/07/1933   Medicare Important Message Given:  Yes    Chapman FitchBOWEN, Storm Sovine T, RN 10/27/2016, 10:48 AM

## 2016-10-27 NOTE — Anesthesia Preprocedure Evaluation (Addendum)
Anesthesia Evaluation  Patient identified by MRN, date of birth, ID band Patient confused    Reviewed: Allergy & Precautions, H&P , NPO status , Patient's Chart, lab work & pertinent test results  History of Anesthesia Complications Negative for: history of anesthetic complications  Airway Mallampati: III  TM Distance: <3 FB Neck ROM: limited    Dental  (+) Poor Dentition, Missing, Chipped, Upper Dentures, Lower Dentures   Pulmonary shortness of breath and with exertion, asthma , sleep apnea , COPD, former smoker,    Pulmonary exam normal breath sounds clear to auscultation       Cardiovascular Exercise Tolerance: Poor hypertension, (-) angina+ CAD, + Past MI and + Peripheral Vascular Disease  Normal cardiovascular exam Rhythm:regular Rate:Normal     Neuro/Psych PSYCHIATRIC DISORDERS Depression CVA, Residual Symptoms    GI/Hepatic negative GI ROS, Neg liver ROS,   Endo/Other  diabetes, Type 2  Renal/GU Renal diseasenegative Renal ROS  negative genitourinary   Musculoskeletal   Abdominal   Peds  Hematology negative hematology ROS (+)   Anesthesia Other Findings Past Medical History: No date: Asthma No date: CAD (coronary artery disease)     Comment: Status post left circumflex stent No date: CKD (chronic kidney disease) stage 3, GFR 30-5* No date: COPD (chronic obstructive pulmonary disease) (* No date: Dementia in Alzheimer's disease with early ons* No date: Diabetes mellitus No date: Hypertension No date: Myocardial infarction No date: OSA (obstructive sleep apnea) No date: Parkinson's disease (HCC) No date: Shortness of breath No date: Stroke [facility])  Past Surgical History: No date: CARDIAC CATHETERIZATION No date: CORONARY ARTERY BYPASS GRAFT No date: EYE SURGERY No date: NASAL SINUS SURGERY  BMI    Body Mass Index:  24.41 kg/m      Reproductive/Obstetrics negative OB ROS                             Anesthesia Physical Anesthesia Plan  ASA: IV  Anesthesia Plan: General   Post-op Pain Management:    Induction:   Airway Management Planned:   Additional Equipment:   Intra-op Plan:   Post-operative Plan:   Informed Consent: I have reviewed the patients History and Physical, chart, labs and discussed the procedure including the risks, benefits and alternatives for the proposed anesthesia with the patient or authorized representative who has indicated his/her understanding and acceptance.   Dental Advisory Given  Plan Discussed with: Anesthesiologist, CRNA and Surgeon  Anesthesia Plan Comments: (Wife consented.  Patient and family informed that patient is higher risk for complications from anesthesia during this procedure due to their medical history and age including but not limited to post operative cognitive dysfunction.  They voiced understanding. )       Anesthesia Quick Evaluation

## 2016-10-27 NOTE — Op Note (Signed)
[facility] Gastroenterology Patient Name: [name] Procedure Date: 10/27/2016 3:58 PM MRN: [id number] Account #: [id number] Date of Birth: 10/11/1933 Admit Type: Inpatient Age: [age] Room[facility] ENDO ROOM 4 Gender: Male Note Status: Finalized Procedure:            Colonoscopy Indications:          Rectal bleeding, Gastrointestinal bleeding Providers:            Scot Junobert T. Cortlan Dolin, MD Referring MD:         Daniel NonesBert Klein, MD (Referring MD) Medicines:            Propofol per Anesthesia Complications:        No immediate complications. Procedure:            Pre-Anesthesia Assessment:                       - After reviewing the risks and benefits, the patient                        was deemed in satisfactory condition to undergo the                        procedure.                       After obtaining informed consent, the colonoscope was                        passed under direct vision. Throughout the procedure,                        the patient's blood pressure, pulse, and oxygen                        saturations were monitored continuously. The                        Colonoscope was introduced through the anus and                        advanced to the the cecum, identified by appendiceal                        orifice and ileocecal valve. The colonoscopy was                        performed without difficulty. The patient tolerated the                        procedure well. The quality of the bowel preparation                        was excellent. Findings:      Multiple small and large-mouthed diverticula were found in the sigmoid       colon and descending colon.      Internal hemorrhoids were found during endoscopy. The hemorrhoids were       small and Grade I (internal hemorrhoids that do not prolapse).      A single small angioectasia without bleeding was found in the cecum. Impression:           -  Diverticulosis in the sigmoid colon and in the             descending colon.                       - Internal hemorrhoids.                       - A single non-bleeding colonic angioectasia.                       - No specimens collected. Recommendation:       - The findings and recommendations were discussed with                        the patient's family. Scot Junobert T Tekla Malachowski, MD 10/27/2016 4:24:34 PM This report has been signed electronically. Number of Addenda: 0 Note Initiated On: 10/27/2016 3:58 PM Scope Withdrawal Time: 0 hours 7 minutes 1 second  Total Procedure Duration: 0 hours 15 minutes 38 seconds       [facility]

## 2016-10-27 NOTE — Transfer of Care (Signed)
Immediate Anesthesia Transfer of Care Note  Patient: [name]  Procedure(s) Performed: Procedure(s): COLONOSCOPY WITH PROPOFOL (N/A)  Patient Location: PACU  Anesthesia Type:General  Level of Consciousness: sedated  Airway & Oxygen Therapy: Patient Spontanous Breathing and Patient connected to nasal cannula oxygen  Post-op Assessment: Report given to RN and Post -op Vital signs reviewed and stable  Post vital signs: Reviewed and stable  Last Vitals:  Vitals:   10/27/16 1510 10/27/16 1627  BP: (!) 157/65 (!) 113/52  Pulse: 60 (!) 55  Resp: 18 17  Temp: 36.7 C 36.4 C    Last Pain:  Vitals:   10/27/16 1627  TempSrc: Tympanic  PainSc: Asleep         Complications: No apparent anesthesia complications

## 2016-10-27 NOTE — Progress Notes (Signed)
[facility] [place] [facility]   PATIENT NAME: [name]    MRN#:  [id number]  DATE OF BIRTH:  05/19/1933  SUBJECTIVE:  Hospital Day: 2 days Raymond ChrisJames Stafford is a 82 y.o. male presenting with Rectal Bleeding .   Overnight events: no overnight events Interval Events: no further bleeding - cscope today, no complaints  REVIEW OF SYSTEMS:  CONSTITUTIONAL: No fever, fatigue or weakness.  EYES: No blurred or double vision.  EARS, NOSE, AND THROAT: No tinnitus or ear pain.  RESPIRATORY: No cough, shortness of breath, wheezing or hemoptysis.  CARDIOVASCULAR: No chest pain, orthopnea, edema.  GASTROINTESTINAL: No nausea, vomiting, diarrhea or abdominal pain.  GENITOURINARY: No dysuria, hematuria.  ENDOCRINE: No polyuria, nocturia,  HEMATOLOGY: No anemia, easy bruising or bleeding SKIN: No rash or lesion. MUSCULOSKELETAL: No joint pain or arthritis.   NEUROLOGIC: No tingling, numbness, weakness.  PSYCHIATRY: No anxiety or depression.   DRUG ALLERGIES:   Allergies  Allergen Reactions  . Lipitor [Atorvastatin] Rash    myalgia    VITALS:  Blood pressure (!) 165/91, pulse 67, temperature 97.7 F (36.5 C), temperature source Oral, resp. rate 16, height 6' (1.829 m), weight 81.6 kg (180 lb), SpO2 94 %.  PHYSICAL EXAMINATION:  VITAL SIGNS: Vitals:   10/27/16 0544 10/27/16 1027  BP: (!) 155/59 (!) 165/91  Pulse: (!) 56 67  Resp: 16   Temp: 97.7 F (36.5 C)    GENERAL:83 y.o.male currently in no acute distress.  HEAD: Normocephalic, atraumatic.  EYES: Pupils equal, round, reactive to light. Extraocular muscles intact. No scleral icterus.  MOUTH: Moist mucosal membrane. Dentition intact. No abscess noted.  EAR, NOSE, THROAT: Clear without exudates. No external lesions.  NECK: Supple. No thyromegaly. No nodules. No JVD.  PULMONARY: Clear to ascultation, without wheeze rails or rhonci. No use of accessory muscles, Good respiratory effort. good air entry  bilaterally CHEST: Nontender to palpation.  CARDIOVASCULAR: S1 and S2. Regular rate and rhythm. No murmurs, rubs, or gallops. No edema. Pedal pulses 2+ bilaterally.  GASTROINTESTINAL: Soft, nontender, nondistended. No masses. Positive bowel sounds. No hepatosplenomegaly.  MUSCULOSKELETAL: No swelling, clubbing, or edema. Range of motion full in all extremities.  NEUROLOGIC: Cranial nerves II through XII are intact. No gross focal neurological deficits. Sensation intact. Reflexes intact.  SKIN: No ulceration, lesions, rashes, or cyanosis. Skin warm and dry. Turgor intact.  PSYCHIATRIC: Mood, affect within normal limits. The patient is awake, alert and oriented x 3. Insight, judgment intact.      LABORATORY PANEL:   CBC  Recent Labs Lab 10/27/16 0751  WBC 4.3  HGB 12.4*  HCT 35.2*  PLT 142*   ------------------------------------------------------------------------------------------------------------------  Chemistries   Recent Labs Lab 10/26/16 0327  NA 135  K 4.1  CL 104  CO2 23  GLUCOSE 121*  BUN 19  CREATININE 1.38*  CALCIUM 8.8*  AST 31  ALT 7*  ALKPHOS 70  BILITOT 1.1   ------------------------------------------------------------------------------------------------------------------  Cardiac Enzymes No results for input(s): TROPONINI in the last 168 hours. ------------------------------------------------------------------------------------------------------------------  RADIOLOGY:  No results found.  EKG:   Orders placed or performed during the hospital encounter of 10/17/16  . ED EKG  . ED EKG  . EKG    ASSESSMENT AND PLAN:   [name] is a 82 y.o. male presenting with Rectal Bleeding . Admitted 10/25/2016 : Day #: 2 days 1. BRBPR: gi eval, for c-scope oday, hgb stable 2. Dm2, non insulin requiring: stable, ISS 3. CAD: asa on hold 4.  htn essential: restart meds as bp tolerates   All the records are reviewed and case discussed with Care  Management/Social Workerr. Management plans discussed with the patient, family and they are in agreement.  CODE STATUS: full TOTAL TIME TAKING CARE OF THIS PATIENT: 28 minutes.   POSSIBLE D/C IN 1-2DAYS, DEPENDING ON CLINICAL CONDITION.   [name],  Mardi MainlandDavid K M.D on 10/27/2016 at 12:31 PM  Between 7am to 6pm - Pager - [phone]  After 6pm: House Pager: - [phone]  [facility]  Office  [phone]  CC: Primary care physician; Lynnea FerrierBERT J KLEIN III, MD

## 2016-10-28 ENCOUNTER — Encounter: Payer: Self-pay | Admitting: Unknown Physician Specialty

## 2016-10-28 LAB — GLUCOSE, CAPILLARY
Glucose-Capillary: 116 mg/dL — ABNORMAL HIGH (ref 65–99)
Glucose-Capillary: 97 mg/dL (ref 65–99)

## 2016-10-28 NOTE — Progress Notes (Signed)
Per Dr. Clint GuyHower hold amlodepine and give losartan even though pt has a blood pressure of 112/58.

## 2016-10-28 NOTE — Progress Notes (Signed)
Pt A and O x 2. Patient at baseline. VSS. Pt tolerating diet well. No complaints of pain or nausea. IV removed intact,no new prescriptions given. Pt voiced understanding of discharge instructions and wife had no further questions. Pt discharged via wheelchair with nursing students.

## 2016-10-28 NOTE — Discharge Summary (Signed)
[place] at [place] NAME: [name]    MR#:  [id number]  DATE OF BIRTH:  15-Oct-1933  DATE OF ADMISSION:  10/25/2016 ADMITTING PHYSICIAN: Nicholes Mango, MD  DATE OF DISCHARGE: 10/28/16  PRIMARY CARE PHYSICIAN: [name], MD    ADMISSION DIAGNOSIS:  Lower GI bleed [K92.2]  DISCHARGE DIAGNOSIS:  Active Problems:   Lower GI bleed   SECONDARY DIAGNOSIS:   Past Medical History:  Diagnosis Date  . Asthma   . CAD (coronary artery disease)    Status post left circumflex stent  . CKD (chronic kidney disease) stage 3, GFR 30-59 ml/min   . COPD (chronic obstructive pulmonary disease) ([place])   . 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 Diabetes mellitus   . Hypertension   . Myocardial infarction   . OSA (obstructive sleep apnea)   . Parkinson's disease ([place])   . Shortness of breath   . Stroke [facility])     HOSPITAL COURSE:  [name]  is a 82 y.o. male admitted 10/25/2016 with chief complaint Rectal Bleeding . Please see H&P performed by Nicholes Mango, MD for further information. Patient presented with the above symptoms, stable hemoglobin underwent colonoscopy - non-bleeding colonic angioectasia and multiple diverticula   DISCHARGE CONDITIONS:   stable  CONSULTS OBTAINED:  Treatment Team:  Manya Silvas, MD  DRUG ALLERGIES:   Allergies  Allergen Reactions  . Lipitor [Atorvastatin] Rash    myalgia    DISCHARGE MEDICATIONS:   Current Discharge Medication List    CONTINUE these medications which have NOT CHANGED   Details  amLODipine (NORVASC) 10 MG tablet take 1 tablet by mouth daily Refills: 3    aspirin EC 81 MG tablet Take 81 mg by mouth at bedtime.     busPIRone (BUSPAR) 15 MG tablet Take 7.5 mg by mouth 2 (two) times daily as needed.    carbidopa-levodopa (SINEMET IR) 25-100 MG tablet Take 1 tablet by mouth 3 (three) times daily.    cyanocobalamin 500 MCG tablet Take 1,000 mcg by mouth  daily.     docusate sodium (COLACE) 100 MG capsule Take 1 capsule (100 mg total) by mouth 2 (two) times daily. Qty: 10 capsule, Refills: 0    donepezil (ARICEPT) 5 MG tablet Take 5 mg by mouth daily.    fluticasone (FLONASE) 50 MCG/ACT nasal spray Place 2 sprays into the nose as needed.     losartan (COZAAR) 100 MG tablet Take 100 mg by mouth daily.     lovastatin (MEVACOR) 40 MG tablet Take 40 mg by mouth at bedtime.     meloxicam (MOBIC) 15 MG tablet take 1 tablet by mouth daily as needed Refills: 5    metoprolol succinate (TOPROL-XL) 25 MG 24 hr tablet Take 1 tablet (25 mg total) by mouth daily. Qty: 30 tablet, Refills: 5    Multiple Vitamin (MULTIVITAMIN WITH MINERALS) TABS tablet Take 1 tablet by mouth daily.    omeprazole (PRILOSEC) 20 MG capsule Take 20 mg by mouth daily.    ondansetron (ZOFRAN) 4 MG tablet Take 1-2 tabs by mouth every 8 hours as needed for nausea/vomiting Qty: 30 tablet, Refills: 0    sertraline (ZOLOFT) 50 MG tablet Take 50 mg by mouth daily.    Blood Glucose Monitoring Suppl (ONE TOUCH ULTRA 2) w/Device KIT FPD Refills: 0    ONE TOUCH ULTRA TEST test strip USE TO TEST BLOOD SUGAR QD Refills: 0  ONETOUCH DELICA LANCETS 09O MISC USE TO TEST BLOOD SUGAR QD Refills: 0    polyethylene glycol (MIRALAX / GLYCOLAX) packet Take 17 g by mouth daily as needed for mild constipation. Qty: 14 each, Refills: 0    PREVNAR 13 SUSP injection ADM 0.5 ML IM 1 TIME FOR 1 DOSE Refills: 0         DISCHARGE INSTRUCTIONS:    DIET:  Diabetic diet  DISCHARGE CONDITION:  Stable  ACTIVITY:  Activity as tolerated  OXYGEN:  Home Oxygen: No.   Oxygen Delivery: room air  DISCHARGE LOCATION:  home   If you experience worsening of your admission symptoms, develop shortness of breath, life threatening emergency, suicidal or homicidal thoughts you must seek medical attention immediately by calling 911 or calling your MD immediately  if symptoms less  severe.  You Must read complete instructions/literature along with all the possible adverse reactions/side effects for all the Medicines you take and that have been prescribed to you. Take any new Medicines after you have completely understood and accpet all the possible adverse reactions/side effects.   Please note  You were cared for by a hospitalist during your hospital stay. If you have any questions about your discharge medications or the care you received while you were in the hospital after you are discharged, you can call the unit and asked to speak with the hospitalist on call if the hospitalist that took care of you is not available. Once you are discharged, your primary care physician will handle any further medical issues. Please note that NO REFILLS for any discharge medications will be authorized once you are discharged, as it is imperative that you return to your primary care physician (or establish a relationship with a primary care physician if you do not have one) for your aftercare needs so that they can reassess your need for medications and monitor your lab values.    On the day of Discharge:   VITAL SIGNS:  Blood pressure (!) 112/58, pulse 75, temperature 98.1 F (36.7 C), temperature source Oral, resp. rate 16, height 6' (1.829 m), weight 81.6 kg (180 lb), SpO2 95 %.  I/O:   Intake/Output Summary (Last 24 hours) at 10/28/16 1104 Last data filed at 10/28/16 0900  Gross per 24 hour  Intake              990 ml  Output              800 ml  Net              190 ml    PHYSICAL EXAMINATION:  GENERAL:  82 y.o.-year-old patient lying in the bed with no acute distress.  EYES: Pupils equal, round, reactive to light and accommodation. No scleral icterus. Extraocular muscles intact.  HEENT: Head atraumatic, normocephalic. Oropharynx and nasopharynx clear.  NECK:  Supple, no jugular venous distention. No thyroid enlargement, no tenderness.  LUNGS: Normal breath sounds  bilaterally, no wheezing, rales,rhonchi or crepitation. No use of accessory muscles of respiration.  CARDIOVASCULAR: S1, S2 normal. No murmurs, rubs, or gallops.  ABDOMEN: Soft, non-tender, non-distended. Bowel sounds present. No organomegaly or mass.  EXTREMITIES: No pedal edema, cyanosis, or clubbing.  NEUROLOGIC: Cranial nerves II through XII are intact. Muscle strength 5/5 in all extremities. Sensation intact. Gait not checked.  PSYCHIATRIC: The patient is alert and oriented x 3.  SKIN: No obvious rash, lesion, or ulcer.   DATA REVIEW:   CBC  Recent Labs Lab 10/27/16 0751  WBC 4.3  HGB 12.4*  HCT 35.2*  PLT 142*    Chemistries   Recent Labs Lab 10/26/16 0327  NA 135  K 4.1  CL 104  CO2 23  GLUCOSE 121*  BUN 19  CREATININE 1.38*  CALCIUM 8.8*  AST 31  ALT 7*  ALKPHOS 70  BILITOT 1.1    Cardiac Enzymes No results for input(s): TROPONINI in the last 168 hours.  Microbiology Results  Results for orders placed or performed during the hospital encounter of 07/31/15  Culture, blood (routine x 2)     Status: None   Collection Time: 07/31/15  8:35 AM  Result Value Ref Range Status   Specimen Description BLOOD LEFT ANTECUBITAL  Final   Special Requests   Final    BOTTLES DRAWN AEROBIC AND ANAEROBIC 4ML ANAEROBIC 6ML AEROBIC   Culture NO GROWTH 5 DAYS  Final   Report Status 08/05/2015 FINAL  Final  Culture, blood (routine x 2)     Status: None   Collection Time: 07/31/15  8:40 AM  Result Value Ref Range Status   Specimen Description BLOOD RESISTANT FATTY CASTS  Final   Special Requests   Final    BOTTLES DRAWN AEROBIC AND ANAEROBIC 6ML AEROBIC 8ML ANAEROBIC   Culture NO GROWTH 5 DAYS  Final   Report Status 08/05/2015 FINAL  Final    RADIOLOGY:  No results found.   Management plans discussed with the patient, family and they are in agreement.  CODE STATUS:     Code Status Orders        Start     Ordered   10/25/16 1510  Full code  Continuous      10/25/16 1509    Code Status History    Date Active Date Inactive Code Status Order ID Comments User Context   10/17/2016  6:28 PM 10/18/2016  7:34 PM Full Code [id number]  [name], MD Inpatient      TOTAL TIME TAKING CARE OF THIS PATIENT: 33 minutes.    ,  Karenann Cai.D on 10/28/2016 at 11:04 AM  Between 7am to 6pm - Pager - [phone]  After 6pm go to [email or web address] - Proofreader  [employer]  [facility]  Office  [phone]  CC: Primary care physician; Adin Hector, MD

## 2016-11-03 ENCOUNTER — Ambulatory Visit: Payer: Medicare Other | Admitting: Podiatry

## 2016-11-05 ENCOUNTER — Ambulatory Visit (INDEPENDENT_AMBULATORY_CARE_PROVIDER_SITE_OTHER): Payer: Medicare Other | Admitting: Podiatry

## 2016-11-05 ENCOUNTER — Encounter: Payer: Self-pay | Admitting: Podiatry

## 2016-11-05 DIAGNOSIS — E1169 Type 2 diabetes mellitus with other specified complication: Secondary | ICD-10-CM

## 2016-11-05 DIAGNOSIS — B351 Tinea unguium: Secondary | ICD-10-CM | POA: Diagnosis not present

## 2016-11-05 DIAGNOSIS — M79676 Pain in unspecified toe(s): Secondary | ICD-10-CM

## 2016-11-05 NOTE — Progress Notes (Signed)
He presents today she complained of pain in limb secondary to elongated toenails.  Objective: Vital signs and was O 3 pulses are palpable. Neurological sensory is intact. He reports are intact. Strength is 5 over 5 dorsiflexion inversion everters all to the musculatures intact. Orthopedic evaluation demonstrates hammertoe deformities bilateral. Toenails are thick and dystrophic with mycotic.  Assessment: Pain elicited onychomycosis.  Plan: Debridement of toenails 1 through 5 bilateral. Follow up within 3 months

## 2016-11-10 ENCOUNTER — Ambulatory Visit: Payer: Medicare Other | Admitting: Occupational Therapy

## 2016-11-11 ENCOUNTER — Other Ambulatory Visit (INDEPENDENT_AMBULATORY_CARE_PROVIDER_SITE_OTHER): Payer: Self-pay | Admitting: Vascular Surgery

## 2016-11-11 DIAGNOSIS — I6523 Occlusion and stenosis of bilateral carotid arteries: Secondary | ICD-10-CM

## 2016-11-17 ENCOUNTER — Ambulatory Visit: Payer: Medicare Other | Attending: Neurology | Admitting: Occupational Therapy

## 2016-11-17 ENCOUNTER — Other Ambulatory Visit (INDEPENDENT_AMBULATORY_CARE_PROVIDER_SITE_OTHER): Payer: Self-pay | Admitting: Vascular Surgery

## 2016-11-17 ENCOUNTER — Encounter: Payer: Self-pay | Admitting: Occupational Therapy

## 2016-11-17 VITALS — BP 110/57 | HR 72

## 2016-11-17 DIAGNOSIS — I6523 Occlusion and stenosis of bilateral carotid arteries: Secondary | ICD-10-CM

## 2016-11-17 DIAGNOSIS — R278 Other lack of coordination: Secondary | ICD-10-CM

## 2016-11-17 DIAGNOSIS — R262 Difficulty in walking, not elsewhere classified: Secondary | ICD-10-CM | POA: Diagnosis present

## 2016-11-17 DIAGNOSIS — G20 Parkinson's disease: Secondary | ICD-10-CM | POA: Diagnosis present

## 2016-11-17 DIAGNOSIS — M6281 Muscle weakness (generalized): Secondary | ICD-10-CM

## 2016-11-17 DIAGNOSIS — R2681 Unsteadiness on feet: Secondary | ICD-10-CM | POA: Diagnosis present

## 2016-11-17 DIAGNOSIS — G20A1 Parkinson's disease without dyskinesia, without mention of fluctuations: Secondary | ICD-10-CM

## 2016-11-18 ENCOUNTER — Ambulatory Visit: Payer: Medicare Other | Admitting: Occupational Therapy

## 2016-11-18 DIAGNOSIS — R262 Difficulty in walking, not elsewhere classified: Secondary | ICD-10-CM | POA: Diagnosis not present

## 2016-11-18 DIAGNOSIS — G20A1 Parkinson's disease without dyskinesia, without mention of fluctuations: Secondary | ICD-10-CM

## 2016-11-18 DIAGNOSIS — M6281 Muscle weakness (generalized): Secondary | ICD-10-CM

## 2016-11-18 DIAGNOSIS — G20 Parkinson's disease: Secondary | ICD-10-CM

## 2016-11-18 DIAGNOSIS — R2681 Unsteadiness on feet: Secondary | ICD-10-CM

## 2016-11-18 DIAGNOSIS — R278 Other lack of coordination: Secondary | ICD-10-CM

## 2016-11-19 ENCOUNTER — Ambulatory Visit: Payer: Medicare Other | Admitting: Occupational Therapy

## 2016-11-19 DIAGNOSIS — R262 Difficulty in walking, not elsewhere classified: Secondary | ICD-10-CM

## 2016-11-19 DIAGNOSIS — R278 Other lack of coordination: Secondary | ICD-10-CM

## 2016-11-19 DIAGNOSIS — M6281 Muscle weakness (generalized): Secondary | ICD-10-CM

## 2016-11-19 DIAGNOSIS — G20A1 Parkinson's disease without dyskinesia, without mention of fluctuations: Secondary | ICD-10-CM

## 2016-11-19 DIAGNOSIS — G20 Parkinson's disease: Secondary | ICD-10-CM

## 2016-11-19 DIAGNOSIS — R2681 Unsteadiness on feet: Secondary | ICD-10-CM

## 2016-11-20 ENCOUNTER — Encounter: Payer: Self-pay | Admitting: Occupational Therapy

## 2016-11-20 ENCOUNTER — Ambulatory Visit (INDEPENDENT_AMBULATORY_CARE_PROVIDER_SITE_OTHER): Payer: Medicare Other | Admitting: Vascular Surgery

## 2016-11-20 ENCOUNTER — Ambulatory Visit: Payer: Medicare Other | Admitting: Occupational Therapy

## 2016-11-20 ENCOUNTER — Encounter (INDEPENDENT_AMBULATORY_CARE_PROVIDER_SITE_OTHER): Payer: Medicare Other

## 2016-11-20 NOTE — Therapy (Signed)
[facility] [address] [place], [place], [address] Phone: [phone]   Fax:  [phone]  Occupational Therapy Treatment  Patient Details  Name: Raymond RoeJames E Garrett MRN: [id number] Date of Birth: 02/06/1933 Referring Provider: Sherryll BurgerShah  Encounter Date: 11/19/2016      OT End of Session - 11/20/16 2347    Visit Number 3   Number of Visits 17   Date for OT Re-Evaluation 12/19/16   Authorization Type Medicare G code 3   OT Start Time 1100   OT Stop Time 1200   OT Time Calculation (min) 60 min   Activity Tolerance Patient tolerated treatment well   Behavior [facility] for tasks assessed/performed      Past Medical History:  Diagnosis Date  . Asthma   . CAD (coronary artery disease)    Status post left circumflex stent  . CKD (chronic kidney disease) stage 3, GFR 30-59 ml/min   . COPD (chronic obstructive pulmonary disease) (HCC)   . 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 Diabetes mellitus   . Hypertension   . Myocardial infarction   . OSA (obstructive sleep apnea)   . Parkinson's disease (HCC)   . Shortness of breath   . Stroke [facility](HCC)     Past Surgical History:  Procedure Laterality Date  . CARDIAC CATHETERIZATION    . COLONOSCOPY WITH PROPOFOL N/A 10/27/2016   Procedure: COLONOSCOPY WITH PROPOFOL;  Surgeon: Scot Junobert T Elliott, MD;  [facility];  Service: Endoscopy;  Laterality: N/A;  . CORONARY ARTERY BYPASS GRAFT    . EYE SURGERY    . NASAL SINUS SURGERY      There were no vitals filed for this visit.      Subjective Assessment - 11/20/16 2346    Subjective  Patient reports he is a little sore from doing exercises the last couple days in therapy.  Wife reports patient is taking bigger steps in the last day and responds to cues for correction of posture. She is impressed with his progress so far.   Patient is accompained by: Family member   Pertinent History Patient was  diagnosed with Parkinson's disease within the last year.  He also has dementia, his wife drives him to appointments and assists him at home. She will need to attend some sessions to learn his exercises to be able to assist him at home since he may not recall specifics of exercises.  She agrees to attend.  Reports patient has had a recent decline in mobility, self care tasks, appetite and endurance for daily tasks.    Currently in Pain? No/denies   Pain Score 0-No pain            [facility] OT Assessment - 11/20/16 2049      Assessment   Diagnosis Parkinson's disease.   Referring Provider Sherryll BurgerShah   Onset Date 12/05/14     Precautions   Precautions Fall     Balance Screen   Has the patient fallen in the past 6 months No   Has the patient had a decrease in activity level because of a fear of falling?  No   Is the patient reluctant to leave their home because of a fear of falling?  No     Home  Environment   Family/patient expects to be discharged to: Private residence   Living Arrangements Spouse/significant other   Available Help at Discharge Family   Type of Home House  Home Access Level entry   Home Layout One level   Bathroom Shower/Tub Tub/Shower unit;Curtain   Shower/tub characteristics Curtain   Bathroom Toilet Handicapped height   Additional Comments just got a seat for shower, needs to get grab bars installed.    Lives With Spouse     Prior Function   Level of Independence Needs assistance with homemaking   Vocation Retired     ADL   Eating/Feeding Modified independent   Grooming Moderate assistance   Upper Body Bathing Moderate assistance   Lower Body Bathing Moderate assistance   Upper Body Dressing Minimal assistance   Lower Body Dressing Modified independent   Toilet Tranfer Modified independent   Toileting - Clothing Manipulation Modified independent   Tub/Shower Transfer Supervision/safety     IADL   Prior Level of Function Shopping requires assistance    Shopping Needs to be accompanied on any shopping trip   Light Housekeeping Does not participate in any housekeeping tasks   Meal Prep Needs to have meals prepared and served   [employer] on family or friends for transportation   Medication Management Is not capable of dispensing or managing own medication   Financial Management Dependent     Mobility   Mobility Status Freezing   Mobility Status Comments does not use any assistive device, forwards flexed posture, small amplitude of steps, shuffling of feet at times, decreased arm swing, decreased turning behaviors, freezing/hesitations with initiation of gait and with turns.       Written Expression   Dominant Hand Right   Handwriting 25% legible     Vision - History   Baseline Vision Wears glasses all the time   Additional Comments loss of vision in right eye due to macular degeneration     Cognition   Overall Cognitive Status History of cognitive impairments - at baseline   Memory Impaired   Memory Impairment Decreased recall of new information;Decreased long term memory;Decreased short term memory     Sensation   Light Touch Appears Intact   Stereognosis Appears Intact   Hot/Cold Appears Intact   Proprioception Appears Intact     Coordination   Gross Motor Movements are Fluid and Coordinated No   Fine Motor Movements are Fluid and Coordinated No   Coordination and Movement Description impaired, tremor in right hand   Finger Nose Finger Test impaired   9 Hole Peg Test Right;Left   Right 9 Hole Peg Test 1 min 12 sec   Left 9 Hole Peg Test 50 sec     ROM / Strength   AROM / PROM / Strength AROM;Strength     AROM   Overall AROM  Within functional limits for tasks performed     Strength   Overall Strength Deficits   Overall Strength Comments 4/5 overall BUE      Hand Function   Right Hand Grip (lbs) 50   Right Hand Lateral Pinch 25 lbs   Right Hand 3 Point Pinch 16 lbs   Left Hand Grip (lbs) 56   Left  Hand Lateral Pinch 22 lbs   Left 3 point pinch 16 lbs                  OT Treatments/Exercises (OP) - 11/20/16 2346      Neurological Re-education Exercises   Other Exercises 1 Patient seen for instruction of LSVT BIG exercises: LSVT Daily Session Maximal Daily Exercises: Sustained movements are designed to rescale the amplitude of movement output for  generalization to daily functional activities. Performed as follows for 1 set of 10 repetitions each: Multi directional sustained movements- 1) Floor to ceiling, 2) Side to side. Multi directional Repetitive movements performed in standing and are designed to provide retraining effort needed for sustained muscle activation in tasks Performed as follows: 3) Step and reach forward, 4) Step and Reach Backwards, 5) Step and reach sideways, 6) Rock and reach forward/backward, 7) Rock and reach sideways. Seated exercises required cues and therapist guiding. For standing exercises, patient required minimal to moderate assist to perform and for balance as well as cues for proper form and technique. Sit to stand from mat table on lowest setting with cues for weight shift, technique and CGA for 10 reps for 1 set.    Other Exercises 2 Patient seen for functional mobility using BIG principles, cues for reciprocal arm swing, amplitude of gait, ambulating 1 set of 250 feet this date with cues and SBA, flat surface, low carpet.                OT Education - 11/20/16 2347    Education provided Yes   Education Details HEP, amplitude of gait, sit to stand   Person(s) Educated Patient;Spouse   Methods Explanation;Demonstration;Verbal cues   Comprehension Verbal cues required;Returned demonstration;Verbalized understanding             OT Long Term Goals - 11/20/16 2114      OT LONG TERM GOAL #1   Title Patient will improve gait speed and endurance and be able to walk 750 feet in 6 minutes to negotiate around the home and community safely in  4 weeks     Baseline 590 feet at eval   Time 4   Period Weeks   Status New     OT LONG TERM GOAL #2   Title Patient will complete HEP for maximal daily exercises with modified independence in 4 weeks      Baseline none at eval   Time 4   Period Weeks   Status New     OT LONG TERM GOAL #3   Title Patient will transfer from sit to stand without the use of arms safely and independently from a variety of chairs/surfaces in 4 weeks.    Baseline difficulty with low surfaces   Time 4   Period Weeks   Status New     OT LONG TERM GOAL #4   Title Patient will complete navigating in narrow spaces without evidence of freezing or hesitations.   Time 4   Period Weeks   Status New     OT LONG TERM GOAL #5   Title Patient will complete bathing with minimal assist   Baseline moderate assist at eval    Time 4   Period Weeks   Status New     Long Term Additional Goals   Additional Long Term Goals Yes     OT LONG TERM GOAL #6   Title Patient will complete UB dressing with modified independence    Baseline min assist   Time 4   Period Weeks   Status New     OT LONG TERM GOAL #7   Title Patient will sign important papers with 50% legibility without evidence of micrographia.   Baseline 25% legibility at eval   Time 4   Period Weeks   Status New               Plan - 11/20/16 2348    Clinical  Impression Statement Patient continues to make progress with maximal daily exercises, he requires frequent rest breaks but is willing to keep going with exercises.  Requires verbal, tactile cues and therapist assist to complete exercises both sitting and standing.  Standing exercises require min to moderate assist and have included adapted version with use of a chair for balance.  Patient is unable to recall exercises but reports they are familiar and he is able to respond to cues for sequence of exercises.  Will plan to issue written/pictorial home program next session in an adapted version  for home.     Rehab Potential Good   Clinical Impairments Affecting Rehab Potential positive:  support, negative: progressive disease, Dementia, balance   OT Frequency 4x / week   OT Duration 4 weeks   OT Treatment/Interventions Self-care/ADL training;Therapeutic exercise;Gait Training;Cognitive remediation/compensation;Neuromuscular education;Stair Training;Functional Development worker, communityMobility Training;Therapeutic exercises;Patient/family education;DME and/or AE instruction;Therapeutic activities;Balance training;Manual Therapy   Consulted and Agree with Plan of Care Patient;Family member/caregiver      Patient will benefit from skilled therapeutic intervention in order to improve the following deficits and impairments:  Abnormal gait, Decreased cognition, Decreased knowledge of use of DME, Impaired flexibility, Pain, Decreased coordination, Decreased mobility, Improper body mechanics, Decreased activity tolerance, Decreased endurance, Decreased strength, Decreased balance, Decreased safety awareness, Difficulty walking, Impaired UE functional use  Visit Diagnosis: Difficulty in walking, not elsewhere classified  Parkinson's disease (HCC)  Muscle weakness (generalized)  Other lack of coordination  Unsteadiness on feet    Problem List Patient Active Problem List   Diagnosis Date Noted  . Lower GI bleed 10/25/2016  . Hyponatremia 10/18/2016  . Bradycardia 10/18/2016  . Malignant essential hypertension 10/18/2016  . CKD (chronic kidney disease), stage III 10/18/2016  . Constipation 10/18/2016  . Nausea & vomiting 10/17/2016  . Absolute anemia 02/18/2016  . Major depression in remission (HCC) 11/20/2015  . Chronic kidney disease (CKD), stage III (moderate) 01/25/2015  . Parkinson's disease (HCC) 01/25/2015  . Arteriosclerosis of coronary artery 07/28/2014  . History of carotid endarterectomy 07/28/2014  . DD (diverticular disease) 07/28/2014  . Calculus of kidney 07/28/2014  . Peripheral  vascular disease (HCC) 07/28/2014  . Thrombocytopenia (HCC) 07/28/2014  . Dementia 03/09/2014  . Obstructive apnea 03/09/2014  . Pure hypercholesterolemia 03/09/2014  . Type 2 diabetes mellitus (HCC) 03/09/2014  . COPD 06/06/2013  . Essential hypertension, benign 06/06/2013  . Chronic obstructive pulmonary disease (HCC) 06/06/2013  . Depression 05/16/2011  . Memory loss 05/16/2011  . Poor circulation 05/16/2011   Iria Jamerson T Arne ClevelandLovett, OTR/L, CLT  Anaeli Cornwall 11/20/2016, 11:49 PM  [facility]IN [facility] [address] [place], [place], [address] Phone: [phone]   Fax:  [phone]  Name: Raymond RoeJames E Goguen MRN: [id number] Date of Birth: 02/28/1933

## 2016-11-20 NOTE — Therapy (Signed)
[place] [facility] MAIN [facility] SERVICES [address] [place], [place], [address] Phone: [phone]   Fax:  [phone]  Occupational Therapy Evaluation  Patient Details  Name: [name] MRN: [id number] Date of Birth: 1933/08/07 Referring Provider: Sherryll Burger  Encounter Date: 11/17/2016      OT End of Session - 11/20/16 2108    Visit Number 1   Number of Visits 17   Date for OT Re-Evaluation 12/19/16   Authorization Type Medicare G code 1   OT Start Time 1114   OT Stop Time 1210   OT Time Calculation (min) 56 min   Activity Tolerance Patient tolerated treatment well   Behavior During Therapy [facility] for tasks assessed/performed      Past Medical History:  Diagnosis Date  . Asthma   . CAD (coronary artery disease)    Status post left circumflex stent  . CKD (chronic kidney disease) stage 3, GFR 30-59 ml/min   . COPD (chronic obstructive pulmonary disease) (HCC)   . 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 Diabetes mellitus   . Hypertension   . Myocardial infarction   . OSA (obstructive sleep apnea)   . Parkinson's disease (HCC)   . Shortness of breath   . Stroke [facility]     Past Surgical History:  Procedure Laterality Date  . CARDIAC CATHETERIZATION    . COLONOSCOPY WITH PROPOFOL N/A 10/27/2016   Procedure: COLONOSCOPY WITH PROPOFOL;  Surgeon: Scot Jun, MD;  Location: [facility] ENDOSCOPY;  Service: Endoscopy;  Laterality: N/A;  . CORONARY ARTERY BYPASS GRAFT    . EYE SURGERY    . NASAL SINUS SURGERY      Vitals:   11/17/16 1122  BP: (!) 110/57  Pulse: 72           [facility] OT Assessment - 11/20/16 2049      Assessment   Diagnosis Parkinson's disease.   Referring Provider Sherryll Burger   Onset Date 12/05/14     Precautions   Precautions Fall     Balance Screen   Has the patient fallen in the past 6 months No   Has the patient had a decrease in activity level because of a fear of falling?  No   Is the patient reluctant  to leave their home because of a fear of falling?  No     Home  Environment   Family/patient expects to be discharged to: Private residence   Living Arrangements Spouse/significant other   Available Help at Discharge Family   Type of Home House   Home Access Level entry   Home Layout One level   Bathroom Shower/Tub Tub/Shower unit;Curtain   Shower/tub characteristics Curtain   Bathroom Toilet Handicapped height   Additional Comments just got a seat for shower, needs to get grab bars installed.    Lives With Spouse     Prior Function   Level of Independence Needs assistance with homemaking   Vocation Retired     ADL   Eating/Feeding Modified independent   Grooming Moderate assistance   Upper Body Bathing Moderate assistance   Lower Body Bathing Moderate assistance   Upper Body Dressing Minimal assistance   Lower Body Dressing Modified independent   Toilet Tranfer Modified independent   Toileting - Clothing Manipulation Modified independent   Tub/Shower Transfer Supervision/safety     IADL   Prior Level of Function Shopping requires assistance   Shopping Needs to be accompanied on any shopping trip  Light Housekeeping Does not participate in any housekeeping tasks   Meal Prep Needs to have meals prepared and served   [employer] on family or friends for transportation   Medication Management Is not capable of dispensing or managing own medication   Financial Management Dependent     Mobility   Mobility Status Freezing   Mobility Status Comments does not use any assistive device, forwards flexed posture, small amplitude of steps, shuffling of feet at times, decreased arm swing, decreased turning behaviors, freezing/hesitations with initiation of gait and with turns.       Written Expression   Dominant Hand Right   Handwriting 25% legible     Vision - History   Baseline Vision Wears glasses all the time   Additional Comments loss of vision in right eye due  to macular degeneration     Cognition   Overall Cognitive Status History of cognitive impairments - at baseline   Memory Impaired   Memory Impairment Decreased recall of new information;Decreased long term memory;Decreased short term memory     Sensation   Light Touch Appears Intact   Stereognosis Appears Intact   Hot/Cold Appears Intact   Proprioception Appears Intact     Coordination   Gross Motor Movements are Fluid and Coordinated No   Fine Motor Movements are Fluid and Coordinated No   Coordination and Movement Description impaired, tremor in right hand   Finger Nose Finger Test impaired   9 Hole Peg Test Right;Left   Right 9 Hole Peg Test 1 min 12 sec   Left 9 Hole Peg Test 50 sec     ROM / Strength   AROM / PROM / Strength AROM;Strength     AROM   Overall AROM  Within functional limits for tasks performed     Strength   Overall Strength Deficits   Overall Strength Comments 4/5 overall BUE      Hand Function   Right Hand Grip (lbs) 50   Right Hand Lateral Pinch 25 lbs   Right Hand 3 Point Pinch 16 lbs   Left Hand Grip (lbs) 56   Left Hand Lateral Pinch 22 lbs   Left 3 point pinch 16 lbs        Patient seen for initiation of maximal daily exercises, cues for technique for sit to stand without use of arms.  Cues for size of steps with functional mobility as well as reciprocal arm swing. 6 minute walk test 590 feet 5 times sit to stand 49 sec Balance to be further assessed, impaired during gait and functional activities.                  OT Education - 11/20/16 2107    Education provided Yes   Education Details role of OT, goals, maximal daily exercises   Person(s) Educated Patient;Spouse   Methods Explanation;Demonstration;Verbal cues;Tactile cues   Comprehension Verbal cues required;Returned demonstration;Verbalized understanding;Tactile cues required             OT Long Term Goals - 11/20/16 2114      OT LONG TERM GOAL #1   Title  Patient will improve gait speed and endurance and be able to walk 750 feet in 6 minutes to negotiate around the home and community safely in 4 weeks     Baseline 590 feet at eval   Time 4   Period Weeks   Status New     OT LONG TERM GOAL #2   Title Patient  will complete HEP for maximal daily exercises with modified independence in 4 weeks      Baseline none at eval   Time 4   Period Weeks   Status New     OT LONG TERM GOAL #3   Title Patient will transfer from sit to stand without the use of arms safely and independently from a variety of chairs/surfaces in 4 weeks.    Baseline difficulty with low surfaces   Time 4   Period Weeks   Status New     OT LONG TERM GOAL #4   Title Patient will complete navigating in narrow spaces without evidence of freezing or hesitations.   Time 4   Period Weeks   Status New     OT LONG TERM GOAL #5   Title Patient will complete bathing with minimal assist   Baseline moderate assist at eval    Time 4   Period Weeks   Status New     Long Term Additional Goals   Additional Long Term Goals Yes     OT LONG TERM GOAL #6   Title Patient will complete UB dressing with modified independence    Baseline min assist   Time 4   Period Weeks   Status New     OT LONG TERM GOAL #7   Title Patient will sign important papers with 50% legibility without evidence of micrographia.   Baseline 25% legibility at eval   Time 4   Period Weeks   Status New               Plan - 11/20/16 2109    Clinical Impression Statement Patient is a 82 yo male diagnosed with Parkinson's disease and was referred by his physician for LSVT BIG program. Patient presents with forwards flexed posture, occasional tremors in the right hand, decreased step length with gait patterns, decreased reciprocal arm swing, decreased balance, freezing of gait with initiation of gait as well as with turns, decreased coordination, and muscle strength which affect his ability to perform  daily tasks. The patient is judged to be an excellent candidate for the LSVT BIG program. He would benefit from and was referred for the LSVT BIG program which is an intensive program designed specifically for Parkinson's patients with a focus on increasing amplitude and speed of movements, improving self-care and daily tasks and providing patients with daily exercises to improve overall function. It is recommended that the patient receive the LSVT BIG program which is comprised of 16 intensive sessions (4 times per week for 4 weeks, one hour sessions). Prognosis for improvement is good based on his motivation and family support. LSVT BIG has been documented in the literature as efficacious for individuals with Parkinson's disease.       Rehab Potential Good   Clinical Impairments Affecting Rehab Potential positive:  support, negative: progressive disease, Dementia, balance   OT Frequency 4x / week   OT Duration 4 weeks   OT Treatment/Interventions Self-care/ADL training;Therapeutic exercise;Gait Training;Cognitive remediation/compensation;Neuromuscular education;Stair Training;Functional Development worker, communityMobility Training;Therapeutic exercises;Patient/family education;DME and/or AE instruction;Therapeutic activities;Balance training;Manual Therapy   Consulted and Agree with Plan of Care Patient;Family member/caregiver      Patient will benefit from skilled therapeutic intervention in order to improve the following deficits and impairments:  Abnormal gait, Decreased cognition, Decreased knowledge of use of DME, Impaired flexibility, Pain, Decreased coordination, Decreased mobility, Improper body mechanics, Decreased activity tolerance, Decreased endurance, Decreased strength, Decreased balance, Decreased safety awareness, Difficulty walking, Impaired UE functional use  Visit Diagnosis: Difficulty in walking, not elsewhere classified - Plan: Ot plan of care cert/re-cert  Parkinson's disease (HCC) - Plan: Ot plan of  care cert/re-cert  Muscle weakness (generalized) - Plan: Ot plan of care cert/re-cert  Other lack of coordination - Plan: Ot plan of care cert/re-cert  Unsteadiness on feet - Plan: Ot plan of care cert/re-cert    Problem List Patient Active Problem List   Diagnosis Date Noted  . Lower GI bleed 10/25/2016  . Hyponatremia 10/18/2016  . Bradycardia 10/18/2016  . Malignant essential hypertension 10/18/2016  . CKD (chronic kidney disease), stage III 10/18/2016  . Constipation 10/18/2016  . Nausea & vomiting 10/17/2016  . Absolute anemia 02/18/2016  . Major depression in remission (HCC) 11/20/2015  . Chronic kidney disease (CKD), stage III (moderate) 01/25/2015  . Parkinson's disease (HCC) 01/25/2015  . Arteriosclerosis of coronary artery 07/28/2014  . History of carotid endarterectomy 07/28/2014  . DD (diverticular disease) 07/28/2014  . Calculus of kidney 07/28/2014  . Peripheral vascular disease (HCC) 07/28/2014  . Thrombocytopenia (HCC) 07/28/2014  . Dementia 03/09/2014  . Obstructive apnea 03/09/2014  . Pure hypercholesterolemia 03/09/2014  . Type 2 diabetes mellitus (HCC) 03/09/2014  . COPD 06/06/2013  . Essential hypertension, benign 06/06/2013  . Chronic obstructive pulmonary disease (HCC) 06/06/2013  . Depression 05/16/2011  . Memory loss 05/16/2011  . Poor circulation 05/16/2011   Branda Chaudhary T Arne ClevelandLovett, OTR/L, CLT  Kasper Mudrick 11/20/2016, 9:44 PM  [facility] [address] [place], [place], [address] Phone: [phone]   Fax:  [phone]  Name: [name] MRN: [id number] Date of Birth: 08/19/1933

## 2016-11-20 NOTE — Therapy (Signed)
[place] [facility] MAIN [facility] SERVICES [address] [place], [place], [address] Phone: [phone]   Fax:  [phone]  Occupational Therapy Treatment  Patient Details  Name: [name] MRN: [id number] Date of Birth: [birth year] Referring Provider: Sherryll Burger  Encounter Date: 11/18/2016      OT End of Session - 11/20/16 2148    Visit Number 2   Number of Visits 17   Date for OT Re-Evaluation 12/19/16   Authorization Type Medicare G code 2   OT Start Time 1100   OT Stop Time 1158   OT Time Calculation (min) 58 min   Activity Tolerance Patient tolerated treatment well   Behavior During Therapy [facility] for tasks assessed/performed      Past Medical History:  Diagnosis Date  . Asthma   . CAD (coronary artery disease)    Status post left circumflex stent  . CKD (chronic kidney disease) stage 3, GFR 30-59 ml/min   . COPD (chronic obstructive pulmonary disease) (HCC)   . 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 Diabetes mellitus   . Hypertension   . Myocardial infarction   . OSA (obstructive sleep apnea)   . Parkinson's disease (HCC)   . Shortness of breath   . Stroke [facility])     Past Surgical History:  Procedure Laterality Date  . CARDIAC CATHETERIZATION    . COLONOSCOPY WITH PROPOFOL N/A 10/27/2016   Procedure: COLONOSCOPY WITH PROPOFOL;  Surgeon: Scot Jun, MD;  Location: [facility] ENDOSCOPY;  Service: Endoscopy;  Laterality: N/A;  . CORONARY ARTERY BYPASS GRAFT    . EYE SURGERY    . NASAL SINUS SURGERY      There were no vitals filed for this visit.      Subjective Assessment - 11/20/16 2147    Subjective  Patient reports he is ready to get started today with therapy.   Patient is accompained by: Family member   Pertinent History Patient was diagnosed with Parkinson's disease within the last year.  He also has dementia, his wife drives him to appointments and assists him at home. She will need to attend some sessions to learn  his exercises to be able to assist him at home since he may not recall specifics of exercises.  She agrees to attend.  Reports patient has had a recent decline in mobility, self care tasks, appetite and endurance for daily tasks.    Patient Stated Goals "I want to be able to do everything myself."   Currently in Pain? No/denies   Pain Score 0-No pain            [facility] OT Assessment - 11/20/16 2049      Assessment   Diagnosis Parkinson's disease.   Referring Provider Sherryll Burger   Onset Date 12/05/14     Precautions   Precautions Fall     Balance Screen   Has the patient fallen in the past 6 months No   Has the patient had a decrease in activity level because of a fear of falling?  No   Is the patient reluctant to leave their home because of a fear of falling?  No     Home  Environment   Family/patient expects to be discharged to: Private residence   Living Arrangements Spouse/significant other   Available Help at Discharge Family   Type of Home House   Home Access Level entry   Home Layout One level   Bathroom Shower/Tub Tub/Shower unit;Curtain  Shower/tub characteristics Curtain   FirefighterBathroom Toilet Handicapped height   Additional Comments just got a seat for shower, needs to get grab bars installed.    Lives With Spouse     Prior Function   Level of Independence Needs assistance with homemaking   Vocation Retired     ADL   Eating/Feeding Modified independent   Grooming Moderate assistance   Upper Body Bathing Moderate assistance   Lower Body Bathing Moderate assistance   Upper Body Dressing Minimal assistance   Lower Body Dressing Modified independent   Toilet Tranfer Modified independent   Toileting - Clothing Manipulation Modified independent   Tub/Shower Transfer Supervision/safety     IADL   Prior Level of Function Shopping requires assistance   Shopping Needs to be accompanied on any shopping trip   Light Housekeeping Does not participate in any housekeeping tasks    Meal Prep Needs to have meals prepared and served   [employer] Mobility Relies on family or friends for transportation   Medication Management Is not capable of dispensing or managing own medication   Financial Management Dependent     Mobility   Mobility Status Freezing   Mobility Status Comments does not use any assistive device, forwards flexed posture, small amplitude of steps, shuffling of feet at times, decreased arm swing, decreased turning behaviors, freezing/hesitations with initiation of gait and with turns.       Written Expression   Dominant Hand Right   Handwriting 25% legible     Vision - History   Baseline Vision Wears glasses all the time   Additional Comments loss of vision in right eye due to macular degeneration     Cognition   Overall Cognitive Status History of cognitive impairments - at baseline   Memory Impaired   Memory Impairment Decreased recall of new information;Decreased long term memory;Decreased short term memory     Sensation   Light Touch Appears Intact   Stereognosis Appears Intact   Hot/Cold Appears Intact   Proprioception Appears Intact     Coordination   Gross Motor Movements are Fluid and Coordinated No   Fine Motor Movements are Fluid and Coordinated No   Coordination and Movement Description impaired, tremor in right hand   Finger Nose Finger Test impaired   9 Hole Peg Test Right;Left   Right 9 Hole Peg Test 1 min 12 sec   Left 9 Hole Peg Test 50 sec     ROM / Strength   AROM / PROM / Strength AROM;Strength     AROM   Overall AROM  Within functional limits for tasks performed     Strength   Overall Strength Deficits   Overall Strength Comments 4/5 overall BUE      Hand Function   Right Hand Grip (lbs) 50   Right Hand Lateral Pinch 25 lbs   Right Hand 3 Point Pinch 16 lbs   Left Hand Grip (lbs) 56   Left Hand Lateral Pinch 22 lbs   Left 3 point pinch 16 lbs                  OT Treatments/Exercises (OP) -  11/20/16 2150      Neurological Re-education Exercises   Other Exercises 1 Patient seen for initial instruction of LSVT BIG exercises: LSVT Daily Session Maximal Daily Exercises: Sustained movements are designed to rescale the amplitude of movement output for generalization to daily functional activities. Performed as follows for 1 set of 10 repetitions each: Multi directional  sustained movements- 1) Floor to ceiling, 2) Side to side. Multi directional Repetitive movements performed in standing and are designed to provide retraining effort needed for sustained muscle activation in tasks Performed as follows: 3) Step and reach forward, 4) Step and Reach Backwards, 5) Step and reach sideways, 6) Rock and reach forward/backward, 7) Rock and reach sideways. Seated exercises required cues and therapist guiding.  For standing exercises, patient required minimal to moderate assist to perform and for balance as well as cues for proper form and technique.  Sit to stand from mat table on lowest setting with cues for weight shift, technique and CGA for 10 reps for 1 set.      Other Exercises 2 Patient seen for functional mobility using BIG principles, cues for reciprocal arm swing, amplitude of gait, ambulating 1 set of 200 feet this date with cues and SBA, flat surface, low carpet.                OT Education - 11/20/16 2148    Education provided Yes   Education Details maximal daily exercises   Person(s) Educated Patient;Spouse   Methods Explanation;Demonstration;Verbal cues;Tactile cues   Comprehension Verbalized understanding;Returned demonstration;Verbal cues required;Tactile cues required             OT Long Term Goals - 11/20/16 2114      OT LONG TERM GOAL #1   Title Patient will improve gait speed and endurance and be able to walk 750 feet in 6 minutes to negotiate around the home and community safely in 4 weeks     Baseline 590 feet at eval   Time 4   Period Weeks   Status New      OT LONG TERM GOAL #2   Title Patient will complete HEP for maximal daily exercises with modified independence in 4 weeks      Baseline none at eval   Time 4   Period Weeks   Status New     OT LONG TERM GOAL #3   Title Patient will transfer from sit to stand without the use of arms safely and independently from a variety of chairs/surfaces in 4 weeks.    Baseline difficulty with low surfaces   Time 4   Period Weeks   Status New     OT LONG TERM GOAL #4   Title Patient will complete navigating in narrow spaces without evidence of freezing or hesitations.   Time 4   Period Weeks   Status New     OT LONG TERM GOAL #5   Title Patient will complete bathing with minimal assist   Baseline moderate assist at eval    Time 4   Period Weeks   Status New     Long Term Additional Goals   Additional Long Term Goals Yes     OT LONG TERM GOAL #6   Title Patient will complete UB dressing with modified independence    Baseline min assist   Time 4   Period Weeks   Status New     OT LONG TERM GOAL #7   Title Patient will sign important papers with 50% legibility without evidence of micrographia.   Baseline 25% legibility at eval   Time 4   Period Weeks   Status New               Plan - 11/20/16 2149    Clinical Impression Statement Patient was seen this date for initiation of maximal daily exercises as  per protocol for LSVT big. Patient required minimal to moderate assistance for completion of exercises with increased assistance for balance during standing exercises. He required moderate verbal cueing for technique. Occasional rest breaks required. He will likely require adapted version of exercises for home program.   Rehab Potential Good   Clinical Impairments Affecting Rehab Potential positive:  support, negative: progressive disease, Dementia, balance   OT Frequency 4x / week   OT Duration 4 weeks   OT Treatment/Interventions Self-care/ADL training;Therapeutic  exercise;Gait Training;Cognitive remediation/compensation;Neuromuscular education;Stair Training;Functional Development worker, communityMobility Training;Therapeutic exercises;Patient/family education;DME and/or AE instruction;Therapeutic activities;Balance training;Manual Therapy   Consulted and Agree with Plan of Care Patient;Family member/caregiver      Patient will benefit from skilled therapeutic intervention in order to improve the following deficits and impairments:  Abnormal gait, Decreased cognition, Decreased knowledge of use of DME, Impaired flexibility, Pain, Decreased coordination, Decreased mobility, Improper body mechanics, Decreased activity tolerance, Decreased endurance, Decreased strength, Decreased balance, Decreased safety awareness, Difficulty walking, Impaired UE functional use  Visit Diagnosis: Difficulty in walking, not elsewhere classified  Parkinson's disease (HCC)  Muscle weakness (generalized)  Other lack of coordination  Unsteadiness on feet    Problem List Patient Active Problem List   Diagnosis Date Noted  . Lower GI bleed 10/25/2016  . Hyponatremia 10/18/2016  . Bradycardia 10/18/2016  . Malignant essential hypertension 10/18/2016  . CKD (chronic kidney disease), stage III 10/18/2016  . Constipation 10/18/2016  . Nausea & vomiting 10/17/2016  . Absolute anemia 02/18/2016  . Major depression in remission (HCC) 11/20/2015  . Chronic kidney disease (CKD), stage III (moderate) 01/25/2015  . Parkinson's disease (HCC) 01/25/2015  . Arteriosclerosis of coronary artery 07/28/2014  . History of carotid endarterectomy 07/28/2014  . DD (diverticular disease) 07/28/2014  . Calculus of kidney 07/28/2014  . Peripheral vascular disease (HCC) 07/28/2014  . Thrombocytopenia (HCC) 07/28/2014  . Dementia 03/09/2014  . Obstructive apnea 03/09/2014  . Pure hypercholesterolemia 03/09/2014  . Type 2 diabetes mellitus (HCC) 03/09/2014  . COPD 06/06/2013  . Essential hypertension, benign  06/06/2013  . Chronic obstructive pulmonary disease (HCC) 06/06/2013  . Depression 05/16/2011  . Memory loss 05/16/2011  . Poor circulation 05/16/2011   Khaiden Segreto T Arne ClevelandLovett, OTR/L, CLT  Deisha Stull 11/20/2016, 10:58 PM  [facility] [address] [place], [place], [address] Phone: [phone]   Fax:  [phone]  Name: Leana RoeJames E Fajardo MRN: [id number] Date of Birth: 01/18/1933

## 2016-11-24 ENCOUNTER — Ambulatory Visit: Payer: Medicare Other | Admitting: Occupational Therapy

## 2016-11-25 ENCOUNTER — Ambulatory Visit: Payer: Medicare Other | Admitting: Occupational Therapy

## 2016-11-26 ENCOUNTER — Ambulatory Visit: Payer: Medicare Other | Admitting: Occupational Therapy

## 2016-11-27 ENCOUNTER — Ambulatory Visit: Payer: Medicare Other | Admitting: Occupational Therapy

## 2016-12-01 ENCOUNTER — Ambulatory Visit: Payer: Medicare Other | Admitting: Occupational Therapy

## 2016-12-02 ENCOUNTER — Ambulatory Visit: Payer: Medicare Other | Admitting: Occupational Therapy

## 2016-12-03 ENCOUNTER — Ambulatory Visit: Payer: Medicare Other | Admitting: Occupational Therapy

## 2016-12-04 ENCOUNTER — Ambulatory Visit: Payer: Medicare Other | Admitting: Occupational Therapy

## 2016-12-08 ENCOUNTER — Ambulatory Visit: Payer: Medicare Other | Admitting: Occupational Therapy

## 2016-12-09 ENCOUNTER — Ambulatory Visit: Payer: Medicare Other | Admitting: Occupational Therapy

## 2016-12-10 ENCOUNTER — Ambulatory Visit: Payer: Medicare Other | Admitting: Occupational Therapy

## 2016-12-11 ENCOUNTER — Ambulatory Visit: Payer: Medicare Other | Admitting: Occupational Therapy

## 2016-12-17 ENCOUNTER — Encounter: Payer: Medicare Other | Admitting: Occupational Therapy

## 2016-12-25 ENCOUNTER — Ambulatory Visit (INDEPENDENT_AMBULATORY_CARE_PROVIDER_SITE_OTHER): Payer: Medicare Other | Admitting: Vascular Surgery

## 2016-12-25 ENCOUNTER — Encounter (INDEPENDENT_AMBULATORY_CARE_PROVIDER_SITE_OTHER): Payer: Medicare Other

## 2017-01-12 ENCOUNTER — Ambulatory Visit (INDEPENDENT_AMBULATORY_CARE_PROVIDER_SITE_OTHER): Payer: Medicare Other | Admitting: Vascular Surgery

## 2017-01-12 ENCOUNTER — Encounter (INDEPENDENT_AMBULATORY_CARE_PROVIDER_SITE_OTHER): Payer: Medicare Other

## 2017-01-22 ENCOUNTER — Ambulatory Visit (INDEPENDENT_AMBULATORY_CARE_PROVIDER_SITE_OTHER): Payer: Medicare Other | Admitting: Vascular Surgery

## 2017-01-22 ENCOUNTER — Ambulatory Visit (INDEPENDENT_AMBULATORY_CARE_PROVIDER_SITE_OTHER): Payer: Medicare Other

## 2017-01-22 ENCOUNTER — Encounter (INDEPENDENT_AMBULATORY_CARE_PROVIDER_SITE_OTHER): Payer: Self-pay | Admitting: Vascular Surgery

## 2017-01-22 VITALS — BP 166/83 | HR 65 | Resp 16 | Ht 74.0 in | Wt 169.0 lb

## 2017-01-22 DIAGNOSIS — N183 Chronic kidney disease, stage 3 unspecified: Secondary | ICD-10-CM

## 2017-01-22 DIAGNOSIS — J449 Chronic obstructive pulmonary disease, unspecified: Secondary | ICD-10-CM

## 2017-01-22 DIAGNOSIS — I10 Essential (primary) hypertension: Secondary | ICD-10-CM | POA: Diagnosis not present

## 2017-01-22 DIAGNOSIS — I6523 Occlusion and stenosis of bilateral carotid arteries: Secondary | ICD-10-CM | POA: Diagnosis not present

## 2017-01-22 DIAGNOSIS — E7800 Pure hypercholesterolemia, unspecified: Secondary | ICD-10-CM

## 2017-01-22 DIAGNOSIS — I6529 Occlusion and stenosis of unspecified carotid artery: Secondary | ICD-10-CM | POA: Insufficient documentation

## 2017-01-22 DIAGNOSIS — I2510 Atherosclerotic heart disease of native coronary artery without angina pectoris: Secondary | ICD-10-CM | POA: Diagnosis not present

## 2017-01-22 NOTE — Progress Notes (Signed)
MRN : [id number]  [name] is a 83 y.o. (10/14/1933) male who presents with chief complaint of  Chief Complaint  Patient presents with  . Re-evaluation    6 month carotid follow up  .  History of Present Illness: The patient is seen for follow up evaluation of carotid stenosis. The carotid stenosis followed by ultrasound.   The patient denies amaurosis fugax. There is no recent history of TIA symptoms or focal motor deficits. There is no prior documented CVA.  The patient is taking enteric-coated aspirin 81 mg daily.  There is no history of migraine headaches. There is no history of seizures.  The patient has a history of coronary artery disease, no recent episodes of angina or shortness of breath. The patient denies PAD or claudication symptoms. There is a history of hyperlipidemia which is being treated with a statin.    Carotid Duplex done today shows RICA <41% and LICA 28-78%.  No change compared to last study in 05/15/2016  Current Meds  Medication Sig  . amLODipine (NORVASC) 10 MG tablet take 1 tablet by mouth daily  . aspirin EC 81 MG tablet Take 81 mg by mouth at bedtime.   . Blood Glucose Monitoring Suppl (ONE TOUCH ULTRA 2) w/Device KIT FPD  . busPIRone (BUSPAR) 15 MG tablet Take 7.5 mg by mouth 2 (two) times daily as needed.  . carbidopa-levodopa (SINEMET IR) 25-100 MG tablet Take 1 tablet by mouth 3 (three) times daily.  . cyanocobalamin 500 MCG tablet Take 1,000 mcg by mouth daily.   Marland Kitchen docusate sodium (COLACE) 100 MG capsule Take 1 capsule (100 mg total) by mouth 2 (two) times daily.  Marland Kitchen donepezil (ARICEPT) 5 MG tablet Take 5 mg by mouth daily.  . fluticasone (FLONASE) 50 MCG/ACT nasal spray Place 2 sprays into the nose as needed.   Marland Kitchen losartan (COZAAR) 100 MG tablet Take 100 mg by mouth daily.   Marland Kitchen lovastatin (MEVACOR) 40 MG tablet Take 40 mg by mouth at bedtime.   . meloxicam (MOBIC) 15 MG tablet take 1 tablet by mouth daily as needed  . metoprolol succinate  (TOPROL-XL) 25 MG 24 hr tablet Take 1 tablet (25 mg total) by mouth daily. (Patient taking differently: Take 12.5 mg by mouth daily. )  . Multiple Vitamin (MULTIVITAMIN WITH MINERALS) TABS tablet Take 1 tablet by mouth daily.  Marland Kitchen omeprazole (PRILOSEC) 20 MG capsule Take 20 mg by mouth daily.  . ondansetron (ZOFRAN) 4 MG tablet Take 1-2 tabs by mouth every 8 hours as needed for nausea/vomiting  . ONE TOUCH ULTRA TEST test strip USE TO TEST BLOOD SUGAR QD  . ONETOUCH DELICA LANCETS 67E MISC USE TO TEST BLOOD SUGAR QD  . polyethylene glycol (MIRALAX / GLYCOLAX) packet Take 17 g by mouth daily as needed for mild constipation.  . sertraline (ZOLOFT) 50 MG tablet Take 50 mg by mouth daily.    Past Medical History:  Diagnosis Date  . Asthma   . CAD (coronary artery disease)    Status post left circumflex stent  . CKD (chronic kidney disease) stage 3, GFR 30-59 ml/min   . COPD (chronic obstructive pulmonary disease) ([place])   . 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 Diabetes mellitus   . Hypertension   . Myocardial infarction   . OSA (obstructive sleep apnea)   . Parkinson's disease ([place])   . Shortness of breath   . Stroke [facility])     Past Surgical History:  Procedure Laterality Date  . CARDIAC CATHETERIZATION    . COLONOSCOPY WITH PROPOFOL N/A 10/27/2016   Procedure: COLONOSCOPY WITH PROPOFOL;  Surgeon: Manya Silvas, MD;  Location: [facility] ENDOSCOPY;  Service: Endoscopy;  Laterality: N/A;  . CORONARY ARTERY BYPASS GRAFT    . EYE SURGERY    . NASAL SINUS SURGERY      Social History Social History  Substance Use Topics  . Smoking status: Former Smoker    Packs/day: 2.00    Years: 50.00    Types: Cigarettes    Quit date: 12/18/2005  . Smokeless tobacco: Never Used     Comment: Quit about 12 years ago  . Alcohol use No    Family History Family History  Problem Relation Age of Onset  . Heart disease Father   . Cancer Father   No family history of bleeding/clotting  disorders, porphyria or autoimmune disease   Allergies  Allergen Reactions  . Lipitor [Atorvastatin] Rash    myalgia     REVIEW OF SYSTEMS (Negative unless checked)  Constitutional: '[]' Weight loss  '[]' Fever  '[]' Chills Cardiac: '[]' Chest pain   '[]' Chest pressure   '[]' Palpitations   '[]' Shortness of breath when laying flat   '[]' Shortness of breath with exertion. Vascular:  '[]' Pain in legs with walking   '[]' Pain in legs at rest  '[]' History of DVT   '[]' Phlebitis   '[]' Swelling in legs   '[]' Varicose veins   '[]' Non-healing ulcers Pulmonary:   '[]' Uses home oxygen   '[]' Productive cough   '[]' Hemoptysis   '[]' Wheeze  '[]' COPD   '[]' Asthma Neurologic:  '[]' Dizziness   '[]' Seizures   '[]' History of stroke   '[]' History of TIA  '[]' Aphasia   '[]' Vissual changes   '[]' Weakness or numbness in arm   '[]' Weakness or numbness in leg Musculoskeletal:   '[]' Joint swelling   '[]' Joint pain   '[]' Low back pain Hematologic:  '[]' Easy bruising  '[]' Easy bleeding   '[]' Hypercoagulable state   '[]' Anemic Gastrointestinal:  '[]' Diarrhea   '[]' Vomiting  '[]' Gastroesophageal reflux/heartburn   '[]' Difficulty swallowing. Genitourinary:  '[]' Chronic kidney disease   '[]' Difficult urination  '[]' Frequent urination   '[]' Blood in urine Skin:  '[]' Rashes   '[]' Ulcers  Psychological:  '[]' History of anxiety   '[]'  History of major depression.  Physical Examination  Vitals:   01/22/17 1516 01/22/17 1517  BP: (!) 155/73 (!) 166/83  Pulse: 66 65  Resp: 16   Weight: 169 lb (76.7 kg)   Height: '6\' 2"'  (1.88 m)    Body mass index is 21.7 kg/m. Gen: WD/WN, NAD Head: [place]/AT, No temporalis wasting.  Ear/Nose/Throat: Hearing grossly intact, nares w/o erythema or drainage, poor dentition Eyes: PER, EOMI, sclera nonicteric.  Neck: Supple, no masses.  No bruit or JVD.  Pulmonary:  Good air movement, clear to auscultation bilaterally, no use of accessory muscles.  Cardiac: RRR, normal S1, S2, no Murmurs. Vascular: bilateral bruits Vessel Right Left  Radial Palpable Palpable  Ulnar Palpable Palpable    Brachial Palpable Palpable  Carotid Palpable Palpable  Femoral Palpable Palpable  Popliteal Palpable Palpable  PT Palpable Palpable  DP Palpable Palpable  Gastrointestinal: soft, non-distended. No guarding/no peritoneal signs.  Musculoskeletal: M/S 5/5 throughout.  No deformity or atrophy.  Neurologic: CN 2-12 intact. Pain and light touch intact in extremities.  Symmetrical.  Speech is fluent. Motor exam as listed above. Psychiatric: Judgment intact, Mood & affect appropriate for pt's clinical situation. Dermatologic: No rashes or ulcers noted.  No changes consistent with cellulitis. Lymph : No Cervical lymphadenopathy, no lichenification or skin changes of  chronic lymphedema.  CBC Lab Results  Component Value Date   WBC 4.3 10/27/2016   HGB 12.4 (L) 10/27/2016   HCT 35.2 (L) 10/27/2016   MCV 92.3 10/27/2016   PLT 142 (L) 10/27/2016    BMET    Component Value Date/Time   NA 135 10/26/2016 0327   NA 137 04/29/2014 1254   K 4.1 10/26/2016 0327   K 4.8 04/29/2014 1254   CL 104 10/26/2016 0327   CL 104 04/29/2014 1254   CO2 23 10/26/2016 0327   CO2 26 04/29/2014 1254   GLUCOSE 121 (H) 10/26/2016 0327   GLUCOSE 125 (H) 04/29/2014 1254   BUN 19 10/26/2016 0327   BUN 16 04/29/2014 1254   CREATININE 1.38 (H) 10/26/2016 0327   CREATININE 1.38 (H) 04/29/2014 1254   CALCIUM 8.8 (L) 10/26/2016 0327   CALCIUM 9.4 04/29/2014 1254   GFRNONAA 46 (L) 10/26/2016 0327   GFRNONAA 49 (L) 04/29/2014 1254   GFRAA 54 (L) 10/26/2016 0327   GFRAA 56 (L) 04/29/2014 1254   CrCl cannot be calculated (Patient's most recent lab result is older than the maximum 21 days allowed.).  COAG Lab Results  Component Value Date   INR 1.04 10/26/2016   INR 1.04 10/25/2016   INR 1.03 06/03/2012    Radiology No results found.  Assessment/Plan 1. Bilateral carotid artery stenosis Recommend:  Given the patient's asymptomatic subcritical stenosis no further invasive testing or surgery at this  time.  Duplex ultrasound shows stable subcritical stenosis bilaterally.  Continue antiplatelet therapy as prescribed Continue management of CAD, HTN and Hyperlipidemia Healthy heart diet,  encouraged exercise at least 4 times per week Follow up in 12 months with duplex ultrasound and physical exam based on the stable >50% stenosis of the LICA carotid artery   - VAS US CAROTID; Future  2. Essential hypertension, benign Continue antihypertensive medications as already ordered, these medications have been reviewed and there are no changes at this time.  3. Arteriosclerosis of coronary artery Continue cardiac and antihypertensive medications as already ordered and reviewed, no changes at this time.  Continue statin as ordered and reviewed, no changes at this time  Nitrates PRN for chest pain  4. COPD Continue pulmonary medications and aerosols as already ordered, these medications have been reviewed and there are no changes at this time.  5. Chronic kidney disease (CKD), stage III (moderate) Continue antihypertensive medications as already ordered, these medications have been reviewed and there are no changes at this time.  Avoid nephrotoxic medications  6. Pure hypercholesterolemia Continue statin as ordered and reviewed, no changes at this time   Hortencia Pilar, MD  01/22/2017 10:12 PM

## 2017-02-09 ENCOUNTER — Encounter: Payer: Self-pay | Admitting: Podiatry

## 2017-02-09 ENCOUNTER — Ambulatory Visit (INDEPENDENT_AMBULATORY_CARE_PROVIDER_SITE_OTHER): Payer: Medicare Other | Admitting: Podiatry

## 2017-02-09 DIAGNOSIS — E1169 Type 2 diabetes mellitus with other specified complication: Secondary | ICD-10-CM

## 2017-02-09 DIAGNOSIS — B351 Tinea unguium: Secondary | ICD-10-CM

## 2017-02-09 DIAGNOSIS — M79676 Pain in unspecified toe(s): Secondary | ICD-10-CM

## 2017-02-09 NOTE — Progress Notes (Signed)
Complaint:  Visit Type: Patient returns to my office for continued preventative foot care services. Complaint: Patient states" my nails have grown long and thick and become painful to walk and wear shoes" Patient has been diagnosed with DM with no foot complications. The patient presents for preventative foot care services. No changes to ROS  Podiatric Exam: Vascular: dorsalis pedis and posterior tibial pulses are weak   bilateral. Capillary return is immediate. Temperature gradient is WNL. Skin turgor WNL  Sensorium: Normal Semmes Weinstein monofilament test. Normal tactile sensation bilaterally. Nail Exam: Pt has thick disfigured discolored nails with subungual debris noted bilateral entire nail hallux through fifth toenails Ulcer Exam: There is no evidence of ulcer or pre-ulcerative changes or infection. Orthopedic Exam: Muscle tone and strength are WNL. No limitations in general ROM. No crepitus or effusions noted. Foot type and digits show no abnormalities. Bony prominences are unremarkable. Skin: No Porokeratosis. No infection or ulcers  Diagnosis:  Onychomycosis, , Pain in right toe, pain in left toes  Treatment & Plan Procedures and Treatment: Consent by patient was obtained for treatment procedures. The patient understood the discussion of treatment and procedures well. All questions were answered thoroughly reviewed. Debridement of mycotic and hypertrophic toenails, 1 through 5 bilateral and clearing of subungual debris. No ulceration, no infection noted.  Return Visit-Office Procedure: Patient instructed to return to the office for a follow up visit 3 months for continued evaluation and treatment.    Helane GuntherGregory Tais Koestner DPM

## 2017-04-16 ENCOUNTER — Ambulatory Visit: Payer: Medicare Other | Admitting: Podiatry

## 2017-04-20 ENCOUNTER — Encounter: Payer: Self-pay | Admitting: Podiatry

## 2017-04-20 ENCOUNTER — Ambulatory Visit (INDEPENDENT_AMBULATORY_CARE_PROVIDER_SITE_OTHER): Payer: Medicare Other | Admitting: Podiatry

## 2017-04-20 DIAGNOSIS — M79676 Pain in unspecified toe(s): Secondary | ICD-10-CM | POA: Diagnosis not present

## 2017-04-20 DIAGNOSIS — B351 Tinea unguium: Secondary | ICD-10-CM

## 2017-04-20 DIAGNOSIS — E1169 Type 2 diabetes mellitus with other specified complication: Secondary | ICD-10-CM

## 2017-04-20 NOTE — Progress Notes (Signed)
Complaint:  Visit Type: Patient returns to my office for continued preventative foot care services. Complaint: Patient states" my nails have grown long and thick and become painful to walk and wear shoes" Patient has been diagnosed with DM with vascular complications.. The patient presents for preventative foot care services. No changes to ROS  Podiatric Exam: Vascular: dorsalis pedis and posterior tibial pulses are weak   bilateral. Capillary return is immediate. Temperature gradient is WNL. Skin turgor WNL  Sensorium: Normal Semmes Weinstein monofilament test. Normal tactile sensation bilaterally. Nail Exam: Pt has thick disfigured discolored nails with subungual debris noted bilateral entire nail hallux through fifth toenails Ulcer Exam: There is no evidence of ulcer or pre-ulcerative changes or infection. Orthopedic Exam: Muscle tone and strength are WNL. No limitations in general ROM. No crepitus or effusions noted. Foot type and digits show no abnormalities. Bony prominences are unremarkable. Skin: No Porokeratosis. No infection or ulcers  Diagnosis:  Onychomycosis, , Pain in right toe, pain in left toes  Treatment & Plan Procedures and Treatment: Consent by patient was obtained for treatment procedures. The patient understood the discussion of treatment and procedures well. All questions were answered thoroughly reviewed. Debridement of mycotic and hypertrophic toenails, 1 through 5 bilateral and clearing of subungual debris. No ulceration, no infection noted.  Return Visit-Office Procedure: Patient instructed to return to the office for a follow up visit 3 months for continued evaluation and treatment.    Helane Gunther DPM

## 2017-05-08 ENCOUNTER — Other Ambulatory Visit: Payer: Self-pay | Admitting: Internal Medicine

## 2017-05-08 DIAGNOSIS — R112 Nausea with vomiting, unspecified: Secondary | ICD-10-CM

## 2017-05-14 ENCOUNTER — Ambulatory Visit
Admission: RE | Admit: 2017-05-14 | Discharge: 2017-05-14 | Disposition: A | Discharge status: Home / Self Care | Payer: Medicare Other | Source: Ambulatory Visit | Attending: Internal Medicine | Admitting: Internal Medicine

## 2017-05-14 DIAGNOSIS — R112 Nausea with vomiting, unspecified: Secondary | ICD-10-CM | POA: Insufficient documentation

## 2017-05-14 DIAGNOSIS — R932 Abnormal findings on diagnostic imaging of liver and biliary tract: Secondary | ICD-10-CM | POA: Diagnosis not present

## 2017-05-14 DIAGNOSIS — N281 Cyst of kidney, acquired: Secondary | ICD-10-CM | POA: Insufficient documentation

## 2017-06-29 ENCOUNTER — Encounter: Payer: Self-pay | Admitting: Podiatry

## 2017-06-29 ENCOUNTER — Ambulatory Visit (INDEPENDENT_AMBULATORY_CARE_PROVIDER_SITE_OTHER): Payer: Medicare Other | Admitting: Podiatry

## 2017-06-29 DIAGNOSIS — B351 Tinea unguium: Secondary | ICD-10-CM | POA: Diagnosis not present

## 2017-06-29 DIAGNOSIS — M79676 Pain in unspecified toe(s): Secondary | ICD-10-CM

## 2017-06-29 DIAGNOSIS — E1169 Type 2 diabetes mellitus with other specified complication: Secondary | ICD-10-CM

## 2017-06-29 NOTE — Progress Notes (Signed)
Complaint:  Visit Type: Patient returns to my office for continued preventative foot care services. Complaint: Patient states" my nails have grown long and thick and become painful to walk and wear shoes" Patient has been diagnosed with DM with vascular complications.. The patient presents for preventative foot care services. No changes to ROS  Podiatric Exam: Vascular: dorsalis pedis and posterior tibial pulses are weak   bilateral. Capillary return is immediate. Temperature gradient is WNL. Skin turgor WNL  Sensorium: Normal Semmes Weinstein monofilament test. Normal tactile sensation bilaterally. Nail Exam: Pt has thick disfigured discolored nails with subungual debris noted bilateral entire nail hallux through fifth toenails Ulcer Exam: There is no evidence of ulcer or pre-ulcerative changes or infection. Orthopedic Exam: Muscle tone and strength are WNL. No limitations in general ROM. No crepitus or effusions noted. Hammer toes 2-4  B/L  Mallet toe 2-4 B/L   Bony prominences are unremarkable. Skin: No Porokeratosis. No infection or ulcers  Diagnosis:  Onychomycosis, , Pain in right toe, pain in left toes  Treatment & Plan Procedures and Treatment: Consent by patient was obtained for treatment procedures. The patient understood the discussion of treatment and procedures well. All questions were answered thoroughly reviewed. Debridement of mycotic and hypertrophic toenails, 1 through 5 bilateral and clearing of subungual debris. No ulceration, no infection noted.  Return Visit-Office Procedure: Patient instructed to return to the office for a follow up visit 3 months for continued evaluation and treatment.    Helane GuntherGregory Latoiya Maradiaga DPM

## 2017-08-31 ENCOUNTER — Encounter: Payer: Self-pay | Admitting: Podiatry

## 2017-08-31 ENCOUNTER — Ambulatory Visit (INDEPENDENT_AMBULATORY_CARE_PROVIDER_SITE_OTHER): Payer: Medicare Other | Admitting: Podiatry

## 2017-08-31 DIAGNOSIS — M79676 Pain in unspecified toe(s): Secondary | ICD-10-CM

## 2017-08-31 DIAGNOSIS — E1169 Type 2 diabetes mellitus with other specified complication: Secondary | ICD-10-CM

## 2017-08-31 DIAGNOSIS — B351 Tinea unguium: Secondary | ICD-10-CM | POA: Diagnosis not present

## 2017-08-31 NOTE — Progress Notes (Signed)
Complaint:  Visit Type: Patient returns to my office for continued preventative foot care services. Complaint: Patient states" my nails have grown long and thick and become painful to walk and wear shoes" Patient has been diagnosed with DM with vascular complications.. The patient presents for preventative foot care services. No changes to ROS  Podiatric Exam: Vascular: dorsalis pedis and posterior tibial pulses are weak   bilateral. Capillary return is immediate. Temperature gradient is WNL. Skin turgor WNL  Sensorium: Normal Semmes Weinstein monofilament test. Normal tactile sensation bilaterally. Nail Exam: Pt has thick disfigured discolored nails with subungual debris noted bilateral entire nail hallux through fifth toenails Ulcer Exam: There is no evidence of ulcer or pre-ulcerative changes or infection. Orthopedic Exam: Muscle tone and strength are WNL. No limitations in general ROM. No crepitus or effusions noted. Hammer toes 2-4  B/L  Mallet toe 2-4 B/L   Bony prominences are unremarkable. Skin: No Porokeratosis. No infection or ulcers  Diagnosis:  Onychomycosis, , Pain in right toe, pain in left toes  Treatment & Plan Procedures and Treatment: Consent by patient was obtained for treatment procedures. The patient understood the discussion of treatment and procedures well. All questions were answered thoroughly reviewed. Debridement of mycotic and hypertrophic toenails, 1 through 5 bilateral and clearing of subungual debris. No ulceration, no infection noted.  Return Visit-Office Procedure: Patient instructed to return to the office for a follow up visit 9 weeks for continued evaluation and treatment.    Helane GuntherGregory Daianna Vasques DPM

## 2017-11-02 ENCOUNTER — Ambulatory Visit (INDEPENDENT_AMBULATORY_CARE_PROVIDER_SITE_OTHER): Payer: Medicare Other | Admitting: Podiatry

## 2017-11-02 ENCOUNTER — Encounter: Payer: Self-pay | Admitting: Podiatry

## 2017-11-02 DIAGNOSIS — B351 Tinea unguium: Secondary | ICD-10-CM | POA: Diagnosis not present

## 2017-11-02 DIAGNOSIS — M79676 Pain in unspecified toe(s): Secondary | ICD-10-CM | POA: Diagnosis not present

## 2017-11-02 DIAGNOSIS — E1169 Type 2 diabetes mellitus with other specified complication: Secondary | ICD-10-CM

## 2017-11-02 NOTE — Progress Notes (Signed)
Complaint:  Visit Type: Patient returns to my office for continued preventative foot care services. Complaint: Patient states" my nails have grown long and thick and become painful to walk and wear shoes" Patient has been diagnosed with DM with vascular complications.. The patient presents for preventative foot care services. No changes to ROS  Podiatric Exam: Vascular: dorsalis pedis and posterior tibial pulses are weak   bilateral. Capillary return is immediate. Temperature gradient is WNL. Skin turgor WNL  Sensorium: Normal Semmes Weinstein monofilament test. Normal tactile sensation bilaterally. Nail Exam: Pt has thick disfigured discolored nails with subungual debris noted bilateral entire nail hallux through fifth toenails Ulcer Exam: There is no evidence of ulcer or pre-ulcerative changes or infection. Orthopedic Exam: Muscle tone and strength are WNL. No limitations in general ROM. No crepitus or effusions noted. Hammer toes 2-4  B/L  Mallet toe 2-4 B/L   Bony prominences are unremarkable. Skin: No Porokeratosis. No infection or ulcers  Diagnosis:  Onychomycosis, , Pain in right toe, pain in left toes  Treatment & Plan Procedures and Treatment: Consent by patient was obtained for treatment procedures. The patient understood the discussion of treatment and procedures well. All questions were answered thoroughly reviewed. Debridement of mycotic and hypertrophic toenails, 1 through 5 bilateral and clearing of subungual debris. No ulceration, no infection noted.  Return Visit-Office Procedure: Patient instructed to return to the office for a follow up visit 9 weeks for continued evaluation and treatment.    Serinity Ware DPM 

## 2018-01-04 ENCOUNTER — Encounter: Payer: Self-pay | Admitting: Podiatry

## 2018-01-04 ENCOUNTER — Ambulatory Visit (INDEPENDENT_AMBULATORY_CARE_PROVIDER_SITE_OTHER): Payer: Medicare Other | Admitting: Podiatry

## 2018-01-04 ENCOUNTER — Ambulatory Visit: Payer: Medicare Other | Admitting: Podiatry

## 2018-01-04 DIAGNOSIS — B351 Tinea unguium: Secondary | ICD-10-CM | POA: Diagnosis not present

## 2018-01-04 DIAGNOSIS — E1169 Type 2 diabetes mellitus with other specified complication: Secondary | ICD-10-CM | POA: Diagnosis not present

## 2018-01-04 DIAGNOSIS — M2042 Other hammer toe(s) (acquired), left foot: Secondary | ICD-10-CM

## 2018-01-04 DIAGNOSIS — M79676 Pain in unspecified toe(s): Secondary | ICD-10-CM

## 2018-01-04 DIAGNOSIS — M2041 Other hammer toe(s) (acquired), right foot: Secondary | ICD-10-CM

## 2018-01-04 DIAGNOSIS — E1142 Type 2 diabetes mellitus with diabetic polyneuropathy: Secondary | ICD-10-CM

## 2018-01-04 NOTE — Progress Notes (Signed)
Complaint:  Visit Type: Patient returns to my office for continued preventative foot care services. Complaint: Patient states" my nails have grown long and thick and become painful to walk and wear shoes" Patient has been diagnosed with DM with vascular complications.. The patient presents for preventative foot care services. No changes to ROS  Podiatric Exam: Vascular: dorsalis pedis and posterior tibial pulses are weak   bilateral. Capillary return is immediate. Temperature gradient is WNL. Skin turgor WNL  Sensorium: Diminished  Semmes Weinstein monofilament test. Normal tactile sensation bilaterally. Nail Exam: Pt has thick disfigured discolored nails with subungual debris noted bilateral entire nail hallux through fifth toenails Ulcer Exam: There is no evidence of ulcer or pre-ulcerative changes or infection. Orthopedic Exam: Muscle tone and strength are WNL. No limitations in general ROM. No crepitus or effusions noted. Hammer toes 2-4  B/L  Mallet toe 2-4 B/L   Bony prominences are unremarkable. Skin: No Porokeratosis. No infection or ulcers  Diagnosis:  Onychomycosis, , Pain in right toe, pain in left toes  Treatment & Plan Procedures and Treatment: Consent by patient was obtained for treatment procedures. The patient understood the discussion of treatment and procedures well. All questions were answered thoroughly reviewed. Debridement of mycotic and hypertrophic toenails, 1 through 5 bilateral and clearing of subungual debris. No ulceration, no infection noted.  Patient to see Angie on Thursday for diabetic shoes. Return Visit-Office Procedure: Patient instructed to return to the office for a follow up visit 9 weeks for continued evaluation and treatment.    Helane GuntherGregory Naveyah Iacovelli DPM

## 2018-01-05 ENCOUNTER — Ambulatory Visit: Payer: Medicare Other

## 2018-01-07 ENCOUNTER — Ambulatory Visit: Payer: Medicare Other

## 2018-01-25 ENCOUNTER — Ambulatory Visit (INDEPENDENT_AMBULATORY_CARE_PROVIDER_SITE_OTHER): Payer: Medicare Other | Admitting: Vascular Surgery

## 2018-01-25 ENCOUNTER — Ambulatory Visit (INDEPENDENT_AMBULATORY_CARE_PROVIDER_SITE_OTHER): Payer: Medicare Other

## 2018-01-25 ENCOUNTER — Encounter (INDEPENDENT_AMBULATORY_CARE_PROVIDER_SITE_OTHER): Payer: Self-pay | Admitting: Vascular Surgery

## 2018-01-25 VITALS — BP 127/62 | HR 66 | Resp 16 | Wt 168.6 lb

## 2018-01-25 DIAGNOSIS — E118 Type 2 diabetes mellitus with unspecified complications: Secondary | ICD-10-CM

## 2018-01-25 DIAGNOSIS — I6523 Occlusion and stenosis of bilateral carotid arteries: Secondary | ICD-10-CM

## 2018-01-25 DIAGNOSIS — J449 Chronic obstructive pulmonary disease, unspecified: Secondary | ICD-10-CM

## 2018-01-25 DIAGNOSIS — I2510 Atherosclerotic heart disease of native coronary artery without angina pectoris: Secondary | ICD-10-CM

## 2018-01-25 DIAGNOSIS — I10 Essential (primary) hypertension: Secondary | ICD-10-CM

## 2018-01-25 DIAGNOSIS — I739 Peripheral vascular disease, unspecified: Secondary | ICD-10-CM | POA: Diagnosis not present

## 2018-01-25 NOTE — Progress Notes (Signed)
MRN : [id number]  [name] is a 84 y.o. (07-16-1933) male who presents with chief complaint of  Chief Complaint  Patient presents with  . Carotid    1 year follow up  .  History of Present Illness: The patient is seen for follow up evaluation of carotid stenosis. The carotid stenosis followed by ultrasound.   The patient denies amaurosis fugax. There is no recent history of TIA symptoms or focal motor deficits. There is no prior documented CVA.  The patient is taking enteric-coated aspirin 81 mg daily.  There is no history of migraine headaches. There is no history of seizures.  The patient has a history of coronary artery disease, no recent episodes of angina or shortness of breath. The patient denies PAD or claudication symptoms. There is a history of hyperlipidemia which is being treated with a statin.   Carotid Duplex done today shows RICA <38% and LICA 10-17%.  No change compared to last study in 01/22/2017   Current Meds  Medication Sig  . amLODipine (NORVASC) 10 MG tablet take 1 tablet by mouth daily  . amLODipine (NORVASC) 10 MG tablet TAKE 1 TABLET EVERY DAY FOR BLOOD PRESSURE  . aspirin EC 81 MG tablet Take 81 mg by mouth at bedtime.   . Blood Glucose Monitoring Suppl (ONE TOUCH ULTRA 2) w/Device KIT FPD  . busPIRone (BUSPAR) 15 MG tablet Take 7.5 mg by mouth 2 (two) times daily as needed.  . busPIRone (BUSPAR) 15 MG tablet TAKE HALF TO ONE TABLET BY MOUTH TWO TIMES DAILY AS NEEDED  . carbidopa-levodopa (SINEMET IR) 25-100 MG tablet Take 1 tablet by mouth 3 (three) times daily.  . carbidopa-levodopa (SINEMET IR) 25-100 MG tablet Take by mouth.  . cyanocobalamin 500 MCG tablet Take 1,000 mcg by mouth daily.   Marland Kitchen levothyroxine (SYNTHROID, LEVOTHROID) 25 MCG tablet   . losartan (COZAAR) 100 MG tablet Take 100 mg by mouth daily.   Marland Kitchen lovastatin (MEVACOR) 40 MG tablet Take 40 mg by mouth at bedtime.   . metoprolol succinate (TOPROL-XL) 25 MG 24 hr tablet TAKE 1  TABLET EVERY DAY  . mirtazapine (REMERON) 30 MG tablet Take by mouth.  . Multiple Vitamin (MULTIVITAMIN WITH MINERALS) TABS tablet Take 1 tablet by mouth daily.  Marland Kitchen omeprazole (PRILOSEC) 20 MG capsule Take 20 mg by mouth daily.  Marland Kitchen omeprazole (PRILOSEC) 20 MG capsule Take by mouth.  . ondansetron (ZOFRAN) 4 MG tablet Take 1-2 tabs by mouth every 8 hours as needed for nausea/vomiting  . ondansetron (ZOFRAN-ODT) 4 MG disintegrating tablet DISSOLVE 1 TABLET ON THE TONGUE EVERY 8 HRS FOR UP TO 3 DAY AS NEEDED FOR NAUSEA OR VOMITING  . ONE TOUCH ULTRA TEST test strip USE TO TEST BLOOD SUGAR QD  . ONETOUCH DELICA LANCETS 51W MISC USE TO TEST BLOOD SUGAR QD  . sertraline (ZOLOFT) 50 MG tablet Take 50 mg by mouth daily.    Past Medical History:  Diagnosis Date  . Asthma   . CAD (coronary artery disease)    Status post left circumflex stent  . CKD (chronic kidney disease) stage 3, GFR 30-59 ml/min (HCC)   . COPD (chronic obstructive pulmonary disease) ([place])   . 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 Diabetes mellitus   . Hypertension   . Myocardial infarction ([place])   . OSA (obstructive sleep apnea)   . Parkinson's disease ([place])   . Shortness of breath   . Stroke [facility])  Past Surgical History:  Procedure Laterality Date  . CARDIAC CATHETERIZATION    . COLONOSCOPY WITH PROPOFOL N/A 10/27/2016   Procedure: COLONOSCOPY WITH PROPOFOL;  Surgeon: Manya Silvas, MD;  Location: [facility] ENDOSCOPY;  Service: Endoscopy;  Laterality: N/A;  . CORONARY ARTERY BYPASS GRAFT    . EYE SURGERY    . NASAL SINUS SURGERY      Social History Social History   Tobacco Use  . Smoking status: Former Smoker    Packs/day: 2.00    Years: 50.00    Pack years: 100.00    Types: Cigarettes    Last attempt to quit: 12/18/2005    Years since quitting: 12.1  . Smokeless tobacco: Never Used  . Tobacco comment: Quit about 12 years ago  Substance Use Topics  . Alcohol use: No  . Drug use: No     Family History Family History  Problem Relation Age of Onset  . Heart disease Father   . Cancer Father     Allergies  Allergen Reactions  . Donepezil Nausea Only  . Atorvastatin Rash and Other (See Comments)    myalgia     REVIEW OF SYSTEMS (Negative unless checked)  Constitutional: '[]' Weight loss  '[]' Fever  '[]' Chills Cardiac: '[]' Chest pain   '[]' Chest pressure   '[]' Palpitations   '[]' Shortness of breath when laying flat   '[]' Shortness of breath with exertion. Vascular:  '[]' Pain in legs with walking   '[]' Pain in legs at rest  '[]' History of DVT   '[]' Phlebitis   '[]' Swelling in legs   '[]' Varicose veins   '[]' Non-healing ulcers Pulmonary:   '[]' Uses home oxygen   '[]' Productive cough   '[]' Hemoptysis   '[]' Wheeze  '[]' COPD   '[]' Asthma Neurologic:  '[]' Dizziness   '[]' Seizures   '[]' History of stroke   '[]' History of TIA  '[]' Aphasia   '[]' Vissual changes   '[]' Weakness or numbness in arm   '[]' Weakness or numbness in leg Musculoskeletal:   '[]' Joint swelling   '[]' Joint pain   '[]' Low back pain Hematologic:  '[]' Easy bruising  '[]' Easy bleeding   '[]' Hypercoagulable state   '[]' Anemic Gastrointestinal:  '[]' Diarrhea   '[]' Vomiting  '[]' Gastroesophageal reflux/heartburn   '[]' Difficulty swallowing. Genitourinary:  '[]' Chronic kidney disease   '[]' Difficult urination  '[]' Frequent urination   '[]' Blood in urine Skin:  '[]' Rashes   '[]' Ulcers  Psychological:  '[]' History of anxiety   '[]'  History of major depression.  Physical Examination  Vitals:   01/25/18 1151 01/25/18 1152  BP: (!) 115/58 127/62  Pulse: 66   Resp: 16   Weight: 168 lb 9.6 oz (76.5 kg)    Body mass index is 21.65 kg/m. Gen: WD/WN, NAD Head: [place]/AT, No temporalis wasting.  Ear/Nose/Throat: Hearing grossly intact, nares w/o erythema or drainage Eyes: PER, EOMI, sclera nonicteric.  Neck: Supple, no large masses.   Pulmonary:  Good air movement, no audible wheezing bilaterally, no use of accessory muscles.  Cardiac: RRR, no JVD Vascular: bilateral carotid bruits Vessel Right Left   Radial Palpable Palpable  Ulnar Palpable Palpable  Brachial Palpable Palpable  Carotid Palpable Palpable  Gastrointestinal: Non-distended. No guarding/no peritoneal signs.  Musculoskeletal: M/S 5/5 throughout.  No deformity or atrophy.  Neurologic: CN 2-12 intact. Symmetrical.  Speech is fluent. Motor exam as listed above. Psychiatric: Judgment intact, Mood & affect appropriate for pt's clinical situation. Dermatologic: No rashes or ulcers noted.  No changes consistent with cellulitis. Lymph : No lichenification or skin changes of chronic lymphedema.  CBC Lab Results  Component Value Date   WBC 4.3 10/27/2016  HGB 12.4 (L) 10/27/2016   HCT 35.2 (L) 10/27/2016   MCV 92.3 10/27/2016   PLT 142 (L) 10/27/2016    BMET    Component Value Date/Time   NA 135 10/26/2016 0327   NA 137 04/29/2014 1254   K 4.1 10/26/2016 0327   K 4.8 04/29/2014 1254   CL 104 10/26/2016 0327   CL 104 04/29/2014 1254   CO2 23 10/26/2016 0327   CO2 26 04/29/2014 1254   GLUCOSE 121 (H) 10/26/2016 0327   GLUCOSE 125 (H) 04/29/2014 1254   BUN 19 10/26/2016 0327   BUN 16 04/29/2014 1254   CREATININE 1.38 (H) 10/26/2016 0327   CREATININE 1.38 (H) 04/29/2014 1254   CALCIUM 8.8 (L) 10/26/2016 0327   CALCIUM 9.4 04/29/2014 1254   GFRNONAA 46 (L) 10/26/2016 0327   GFRNONAA 49 (L) 04/29/2014 1254   GFRAA 54 (L) 10/26/2016 0327   GFRAA 56 (L) 04/29/2014 1254   CrCl cannot be calculated (Patient's most recent lab result is older than the maximum 21 days allowed.).  COAG Lab Results  Component Value Date   INR 1.04 10/26/2016   INR 1.04 10/25/2016   INR 1.03 06/03/2012    Radiology No results found.  Assessment/Plan 1. Bilateral carotid artery stenosis Recommend:  Given the patient's asymptomatic subcritical stenosis no further invasive testing or surgery at this time.  Duplex ultrasound shows stable subcritical stenosis bilaterally.  Continue antiplatelet therapy as prescribed Continue  management of CAD, HTN and Hyperlipidemia Healthy heart diet,  encouraged exercise at least 4 times per week Follow up in 12 months with duplex ultrasound and physical exam based on the stable >50% stenosis of the LICA carotid artery    - VAS US CAROTID; Future  2. Peripheral vascular disease ([place])  Recommend:  The patient has evidence of atherosclerosis of the lower extremities with claudication.  The patient does not voice lifestyle limiting changes at this point in time.  Noninvasive studies do not suggest clinically significant change.  No invasive studies, angiography or surgery at this time The patient should continue walking and begin a more formal exercise program.  The patient should continue antiplatelet therapy and aggressive treatment of the lipid abnormalities  No changes in the patient's medications at this time  The patient should continue wearing graduated compression socks 10-15 mmHg strength to control the mild edema.    3. Arteriosclerosis of coronary artery Continue cardiac and antihypertensive medications as already ordered and reviewed, no changes at this time.  Continue statin as ordered and reviewed, no changes at this time  Nitrates PRN for chest pain   4. Essential hypertension, benign Continue antihypertensive medications as already ordered, these medications have been reviewed and there are no changes at this time.   5. COPD Continue pulmonary medications and aerosols as already ordered, these medications have been reviewed and there are no changes at this time.    6. Type 2 diabetes mellitus with complication, without long-term current use of insulin (HCC) Continue hypoglycemic medications as already ordered, these medications have been reviewed and there are no changes at this time.  Hgb A1C to be monitored as already arranged by primary service     Hortencia Pilar, MD  01/25/2018 10:09 PM

## 2018-01-28 LAB — VAS US CAROTID
LCCADDIAS: -10 cm/s
LEFT ECA DIAS: -22 cm/s
LICADDIAS: -11 cm/s
Left CCA dist sys: -77 cm/s
Left CCA prox dias: 13 cm/s
Left CCA prox sys: 82 cm/s
Left ICA dist sys: -49 cm/s
Left ICA prox dias: -28 cm/s
Left ICA prox sys: -172 cm/s
RCCAPSYS: 65 cm/s
RIGHT CCA MID DIAS: 12 cm/s
Right CCA prox dias: 5 cm/s
Right cca dist sys: -73 cm/s

## 2018-02-03 ENCOUNTER — Ambulatory Visit: Payer: Medicare Other | Admitting: Orthotics

## 2018-02-10 ENCOUNTER — Ambulatory Visit (INDEPENDENT_AMBULATORY_CARE_PROVIDER_SITE_OTHER): Payer: Medicare Other | Admitting: Orthotics

## 2018-02-10 DIAGNOSIS — M2041 Other hammer toe(s) (acquired), right foot: Secondary | ICD-10-CM | POA: Diagnosis not present

## 2018-02-10 DIAGNOSIS — M79673 Pain in unspecified foot: Secondary | ICD-10-CM

## 2018-02-10 DIAGNOSIS — M2042 Other hammer toe(s) (acquired), left foot: Secondary | ICD-10-CM | POA: Diagnosis not present

## 2018-02-10 DIAGNOSIS — E1142 Type 2 diabetes mellitus with diabetic polyneuropathy: Secondary | ICD-10-CM | POA: Diagnosis not present

## 2018-02-10 NOTE — Progress Notes (Signed)

## 2018-03-15 ENCOUNTER — Encounter: Payer: Self-pay | Admitting: Podiatry

## 2018-03-15 ENCOUNTER — Ambulatory Visit (INDEPENDENT_AMBULATORY_CARE_PROVIDER_SITE_OTHER): Payer: Medicare Other | Admitting: Podiatry

## 2018-03-15 DIAGNOSIS — M79676 Pain in unspecified toe(s): Secondary | ICD-10-CM | POA: Diagnosis not present

## 2018-03-15 DIAGNOSIS — B351 Tinea unguium: Secondary | ICD-10-CM | POA: Diagnosis not present

## 2018-03-15 DIAGNOSIS — E1142 Type 2 diabetes mellitus with diabetic polyneuropathy: Secondary | ICD-10-CM

## 2018-03-15 DIAGNOSIS — E1159 Type 2 diabetes mellitus with other circulatory complications: Secondary | ICD-10-CM

## 2018-03-15 NOTE — Progress Notes (Signed)
Complaint:  Visit Type: Patient returns to my office for continued preventative foot care services. Complaint: Patient states" my nails have grown long and thick and become painful to walk and wear shoes" Patient has been diagnosed with DM with vascular complications.. The patient presents for preventative foot care services. No changes to ROS  Podiatric Exam: Vascular: dorsalis pedis and posterior tibial pulses are weak   bilateral. Capillary return is immediate. Temperature gradient is WNL. Skin turgor WNL  Sensorium: Diminished  Semmes Weinstein monofilament test. Normal tactile sensation bilaterally. Nail Exam: Pt has thick disfigured discolored nails with subungual debris noted bilateral entire nail hallux through fifth toenails Ulcer Exam: There is no evidence of ulcer or pre-ulcerative changes or infection. Orthopedic Exam: Muscle tone and strength are WNL. No limitations in general ROM. No crepitus or effusions noted. Hammer toes 2-4  B/L  Mallet toe 2-4 B/L   Bony prominences are unremarkable. Skin: No Porokeratosis. No infection or ulcers  Diagnosis:  Onychomycosis, , Pain in right toe, pain in left toes  Treatment & Plan Procedures and Treatment: Consent by patient was obtained for treatment procedures. The patient understood the discussion of treatment and procedures well. All questions were answered thoroughly reviewed. Debridement of mycotic and hypertrophic toenails, 1 through 5 bilateral and clearing of subungual debris. No ulceration, no infection noted.   Return Visit-Office Procedure: Patient instructed to return to the office for a follow up visit 10  weeks for continued evaluation and treatment.    [name] DPM 

## 2018-04-11 IMAGING — US US ABDOMEN COMPLETE
1 series · 14 of 25 positions shown · non-contrast
Comparison: KUB 10/17/2016.  Ultrasound 10/17/2016.

CLINICAL DATA: Nausea and vomiting.

EXAM:
ABDOMEN ULTRASOUND COMPLETE

[Series 1: us abdomen complete · 0.20mm/px · 14 of 95 slices shown]
[im 1/95]
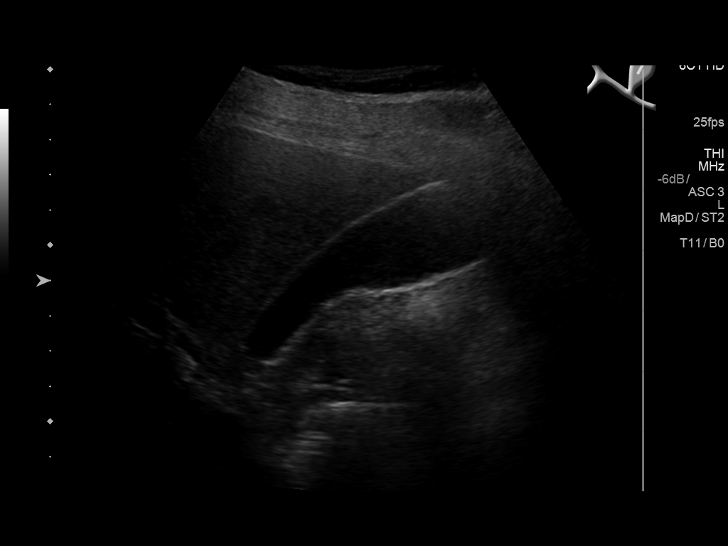
[im 8/95]
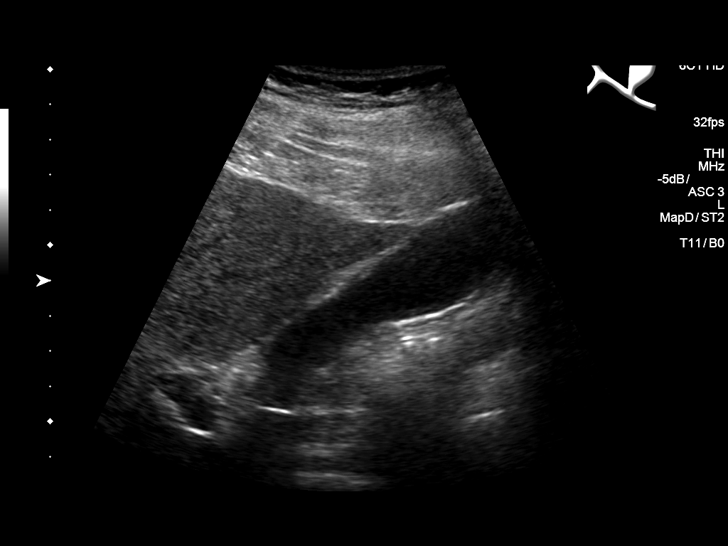
[im 16/95]
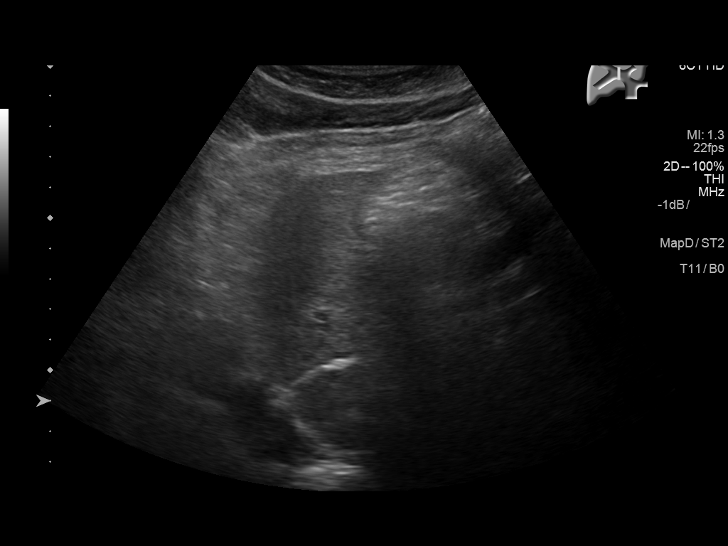
[im 24/95]
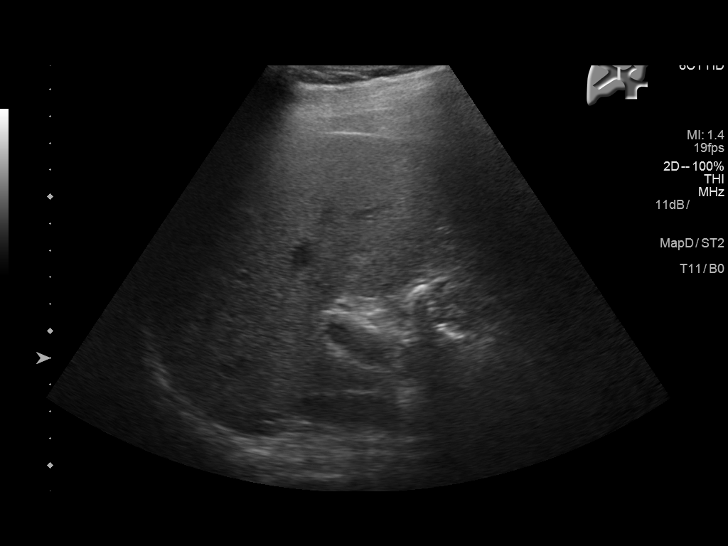
[im 32/95]
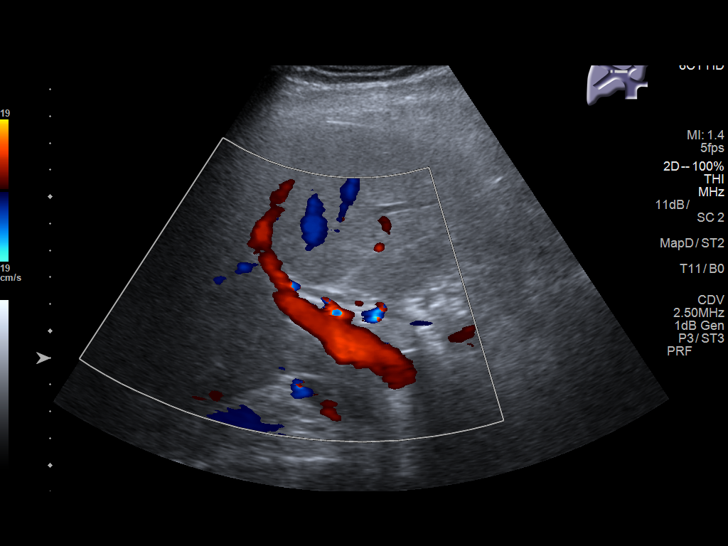
[im 36/95]
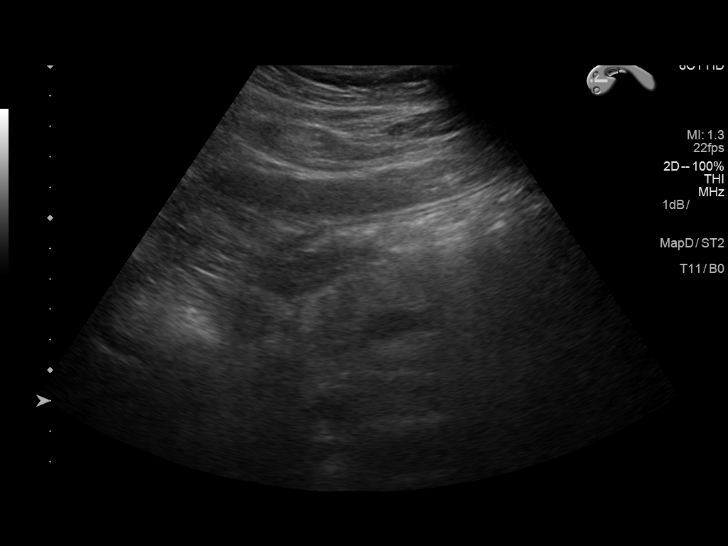
[im 44/95]
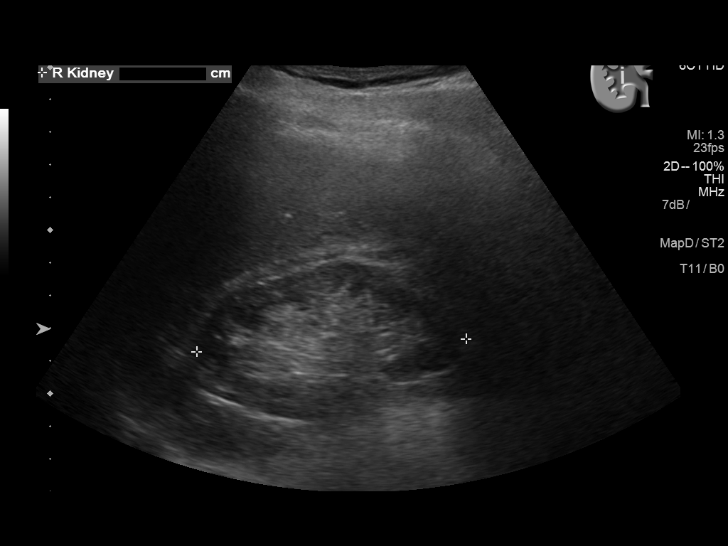
[im 51/95]
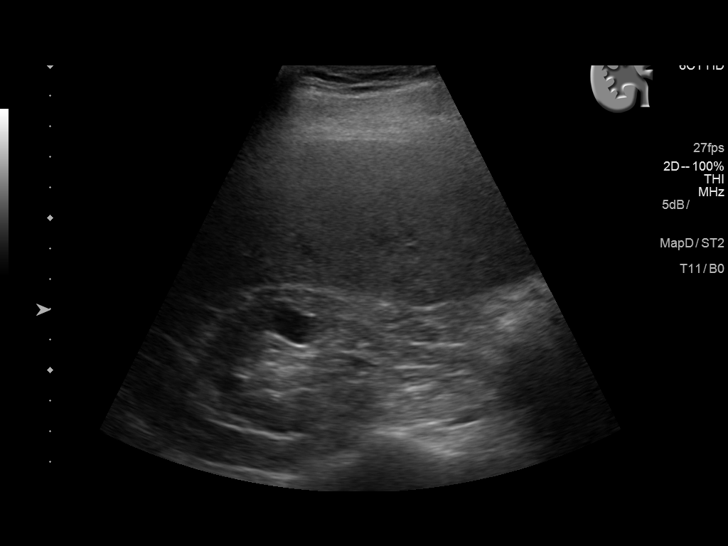
[im 59/95]
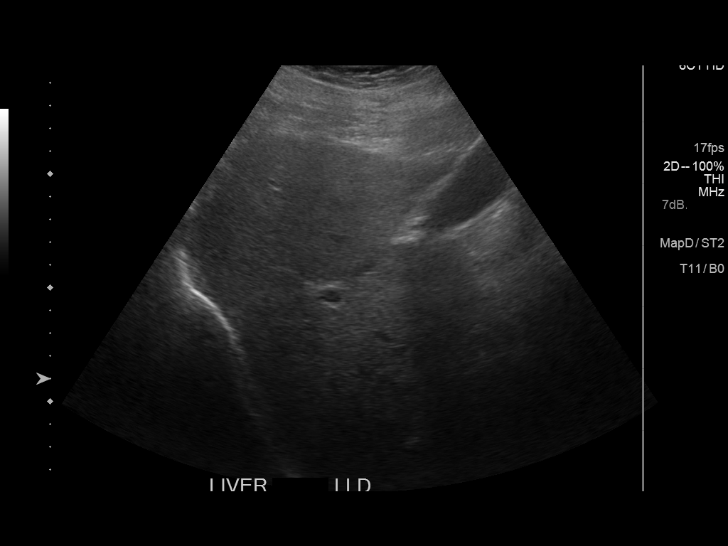
[im 63/95]
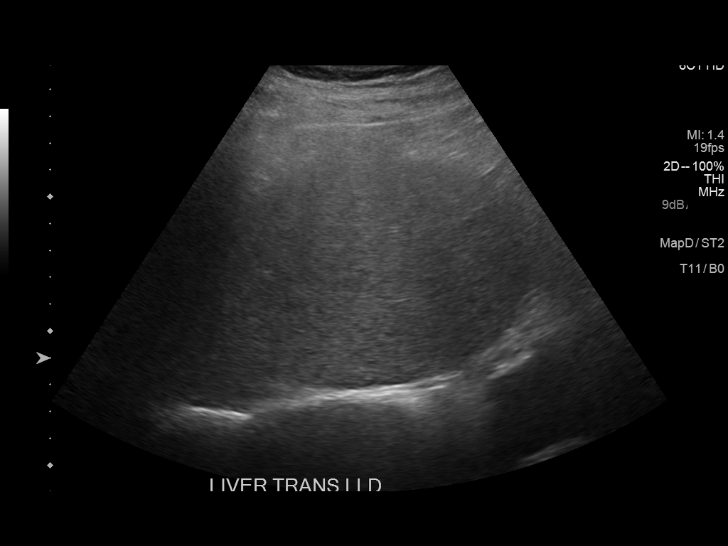
[im 71/95]
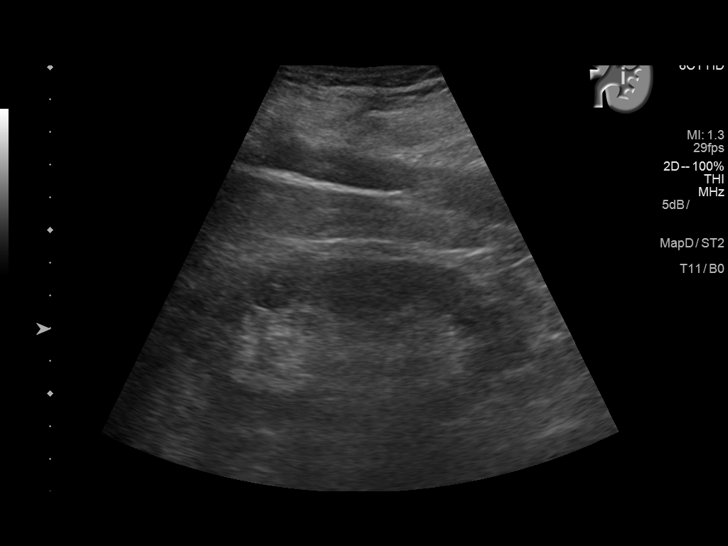
[im 79/95]
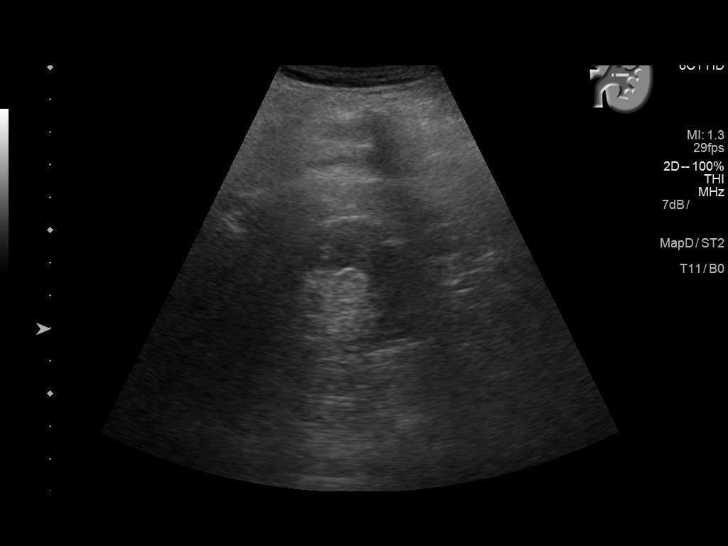
[im 87/95]
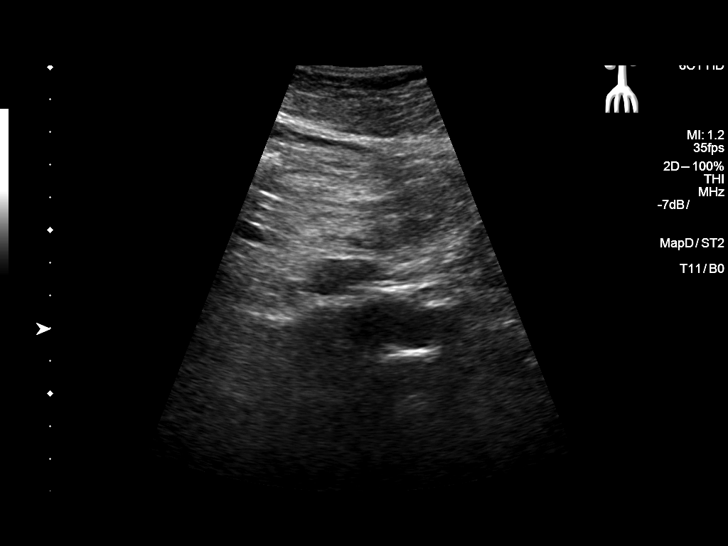
[im 95/95]
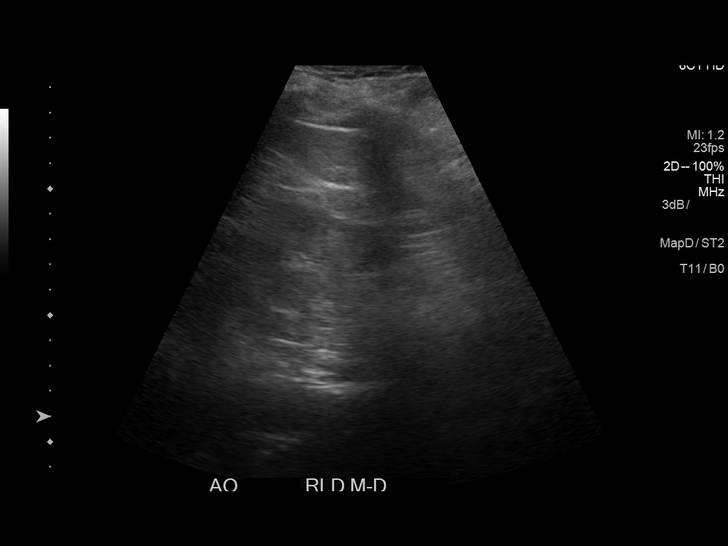

[14 of 25 positions shown; findings below may reference images not displayed]

FINDINGS: Gallbladder: No gallstones or wall thickening visualized. No
sonographic Murphy sign noted by sonographer.

Common bile duct: Diameter: 4 mm

Liver: Increased echogenicity consistent with fatty infiltration
and/or hepatocellular disease. No focal hepatic abnormality
identified.

IVC: No abnormality visualized.

Pancreas: Visualized portion unremarkable.

Spleen: Size and appearance within normal limits.

Right Kidney: Length: 8.8 cm. Echogenicity within normal limits. No
hydronephrosis visualized. 1.4 cm simple cyst right kidney

Left Kidney: Length: 9.0 cm. Echogenicity within normal limits. No
mass or hydronephrosis visualized.

Abdominal aorta: No aneurysm visualized.

Other findings: None.
IMPRESSION: 1. Increased echogenicity of the liver consistent with fatty
infiltration and/or pedis 80 disease. No gallstones or biliary
distention.

2.  1.4 cm simple cyst right kidney.

## 2018-05-27 ENCOUNTER — Encounter: Payer: Self-pay | Admitting: Podiatry

## 2018-05-27 ENCOUNTER — Ambulatory Visit (INDEPENDENT_AMBULATORY_CARE_PROVIDER_SITE_OTHER): Payer: Medicare Other | Admitting: Podiatry

## 2018-05-27 DIAGNOSIS — M79676 Pain in unspecified toe(s): Secondary | ICD-10-CM

## 2018-05-27 DIAGNOSIS — B351 Tinea unguium: Secondary | ICD-10-CM | POA: Diagnosis not present

## 2018-05-27 DIAGNOSIS — M2042 Other hammer toe(s) (acquired), left foot: Secondary | ICD-10-CM

## 2018-05-27 DIAGNOSIS — E1142 Type 2 diabetes mellitus with diabetic polyneuropathy: Secondary | ICD-10-CM

## 2018-05-27 DIAGNOSIS — M2041 Other hammer toe(s) (acquired), right foot: Secondary | ICD-10-CM

## 2018-05-27 NOTE — Progress Notes (Signed)
Complaint:  Visit Type: Patient returns to my office for continued preventative foot care services. Complaint: Patient states" my nails have grown long and thick and become painful to walk and wear shoes" Patient has been diagnosed with DM with vascular complications.. The patient presents for preventative foot care services. No changes to ROS  Podiatric Exam: Vascular: dorsalis pedis and posterior tibial pulses are weak   bilateral. Capillary return is immediate. Temperature gradient is WNL. Skin turgor WNL  Sensorium: Diminished  Semmes Weinstein monofilament test. Normal tactile sensation bilaterally. Nail Exam: Pt has thick disfigured discolored nails with subungual debris noted bilateral entire nail hallux through fifth toenails Ulcer Exam: There is no evidence of ulcer or pre-ulcerative changes or infection. Orthopedic Exam: Muscle tone and strength are WNL. No limitations in general ROM. No crepitus or effusions noted. Hammer toes 2-4  B/L  Mallet toe 2-4 B/L   Bony prominences are unremarkable. Skin: No Porokeratosis. No infection or ulcers  Diagnosis:  Onychomycosis, , Pain in right toe, pain in left toes  Treatment & Plan Procedures and Treatment: Consent by patient was obtained for treatment procedures. The patient understood the discussion of treatment and procedures well. All questions were answered thoroughly reviewed. Debridement of mycotic and hypertrophic toenails, 1 through 5 bilateral and clearing of subungual debris. No ulceration, no infection noted.   Return Visit-Office Procedure: Patient instructed to return to the office for a follow up visit 10  weeks for continued evaluation and treatment.    Sahib Pella DPM 

## 2018-07-29 ENCOUNTER — Ambulatory Visit (INDEPENDENT_AMBULATORY_CARE_PROVIDER_SITE_OTHER): Payer: Medicare Other | Admitting: Podiatry

## 2018-07-29 ENCOUNTER — Encounter: Payer: Self-pay | Admitting: Podiatry

## 2018-07-29 DIAGNOSIS — M79676 Pain in unspecified toe(s): Secondary | ICD-10-CM

## 2018-07-29 DIAGNOSIS — E1142 Type 2 diabetes mellitus with diabetic polyneuropathy: Secondary | ICD-10-CM

## 2018-07-29 DIAGNOSIS — B351 Tinea unguium: Secondary | ICD-10-CM | POA: Diagnosis not present

## 2018-07-29 NOTE — Progress Notes (Signed)
Complaint:  Visit Type: Patient returns to my office for continued preventative foot care services. Complaint: Patient states" my nails have grown long and thick and become painful to walk and wear shoes" Patient has been diagnosed with DM with vascular complications.. The patient presents for preventative foot care services. No changes to ROS  Podiatric Exam: Vascular: dorsalis pedis and posterior tibial pulses are weak   bilateral. Capillary return is immediate. Temperature gradient is WNL. Skin turgor WNL  Sensorium: Diminished  Semmes Weinstein monofilament test. Normal tactile sensation bilaterally. Nail Exam: Pt has thick disfigured discolored nails with subungual debris noted bilateral entire nail hallux through fifth toenails Ulcer Exam: There is no evidence of ulcer or pre-ulcerative changes or infection. Orthopedic Exam: Muscle tone and strength are WNL. No limitations in general ROM. No crepitus or effusions noted. Hammer toes 2-4  B/L  Mallet toe 2-4 B/L   Bony prominences are unremarkable. Skin: No Porokeratosis. No infection or ulcers  Diagnosis:  Onychomycosis, , Pain in right toe, pain in left toes  Treatment & Plan Procedures and Treatment: Consent by patient was obtained for treatment procedures. The patient understood the discussion of treatment and procedures well. All questions were answered thoroughly reviewed. Debridement of mycotic and hypertrophic toenails, 1 through 5 bilateral and clearing of subungual debris. No ulceration, no infection noted.   Return Visit-Office Procedure: Patient instructed to return to the office for a follow up visit 10  weeks for continued evaluation and treatment.    Gabriele Loveland DPM 

## 2018-08-05 DIAGNOSIS — F0150 Vascular dementia without behavioral disturbance: Secondary | ICD-10-CM | POA: Insufficient documentation

## 2018-09-30 ENCOUNTER — Other Ambulatory Visit: Payer: Self-pay | Admitting: Internal Medicine

## 2018-09-30 DIAGNOSIS — R1110 Vomiting, unspecified: Secondary | ICD-10-CM

## 2018-10-04 ENCOUNTER — Ambulatory Visit
Admission: RE | Admit: 2018-10-04 | Discharge: 2018-10-04 | Disposition: A | Discharge status: Home / Self Care | Payer: Medicare Other | Source: Ambulatory Visit | Attending: Internal Medicine | Admitting: Internal Medicine

## 2018-10-04 DIAGNOSIS — R1110 Vomiting, unspecified: Secondary | ICD-10-CM | POA: Diagnosis not present

## 2018-10-04 MED ORDER — TECHNETIUM TC 99M SULFUR COLLOID
2.4800 | Freq: Once | INTRAVENOUS | Status: AC | PRN
  Administered 2018-10-04: 2.48 via ORAL

## 2018-10-14 ENCOUNTER — Encounter: Payer: Self-pay | Admitting: Podiatry

## 2018-10-14 ENCOUNTER — Ambulatory Visit (INDEPENDENT_AMBULATORY_CARE_PROVIDER_SITE_OTHER): Payer: Medicare Other | Admitting: Podiatry

## 2018-10-14 DIAGNOSIS — M79676 Pain in unspecified toe(s): Secondary | ICD-10-CM | POA: Diagnosis not present

## 2018-10-14 DIAGNOSIS — B351 Tinea unguium: Secondary | ICD-10-CM | POA: Diagnosis not present

## 2018-10-14 DIAGNOSIS — M2041 Other hammer toe(s) (acquired), right foot: Secondary | ICD-10-CM

## 2018-10-14 DIAGNOSIS — M2042 Other hammer toe(s) (acquired), left foot: Secondary | ICD-10-CM

## 2018-10-14 DIAGNOSIS — E1142 Type 2 diabetes mellitus with diabetic polyneuropathy: Secondary | ICD-10-CM | POA: Diagnosis not present

## 2018-10-14 DIAGNOSIS — E1159 Type 2 diabetes mellitus with other circulatory complications: Secondary | ICD-10-CM

## 2018-10-14 NOTE — Progress Notes (Signed)
Complaint:  Visit Type: Patient returns to my office for continued preventative foot care services. Complaint: Patient states" my nails have grown long and thick and become painful to walk and wear shoes" Patient has been diagnosed with DM with vascular complications.. The patient presents for preventative foot care services. No changes to ROS  Podiatric Exam: Vascular: dorsalis pedis and posterior tibial pulses are weak   bilateral. Capillary return is immediate. Temperature gradient is WNL. Skin turgor WNL  Sensorium: Diminished  Semmes Weinstein monofilament test. Normal tactile sensation bilaterally. Nail Exam: Pt has thick disfigured discolored nails with subungual debris noted bilateral entire nail hallux through fifth toenails Ulcer Exam: There is no evidence of ulcer or pre-ulcerative changes or infection. Orthopedic Exam: Muscle tone and strength are WNL. No limitations in general ROM. No crepitus or effusions noted. Hammer toes 2-4  B/L  Mallet toe 2-4 B/L   Bony prominences are unremarkable. Skin: No Porokeratosis. No infection or ulcers  Diagnosis:  Onychomycosis, , Pain in right toe, pain in left toes  Treatment & Plan Procedures and Treatment: Consent by patient was obtained for treatment procedures. The patient understood the discussion of treatment and procedures well. All questions were answered thoroughly reviewed. Debridement of mycotic and hypertrophic toenails, 1 through 5 bilateral and clearing of subungual debris. No ulceration, no infection noted.   Return Visit-Office Procedure: Patient instructed to return to the office for a follow up visit 10  weeks for continued evaluation and treatment.    Valynn Schamberger DPM 

## 2018-10-18 ENCOUNTER — Ambulatory Visit: Payer: Medicare Other

## 2018-12-30 ENCOUNTER — Ambulatory Visit (INDEPENDENT_AMBULATORY_CARE_PROVIDER_SITE_OTHER): Payer: Medicare Other | Admitting: Podiatry

## 2018-12-30 ENCOUNTER — Encounter: Payer: Self-pay | Admitting: Podiatry

## 2018-12-30 DIAGNOSIS — M79676 Pain in unspecified toe(s): Secondary | ICD-10-CM | POA: Diagnosis not present

## 2018-12-30 DIAGNOSIS — M2041 Other hammer toe(s) (acquired), right foot: Secondary | ICD-10-CM

## 2018-12-30 DIAGNOSIS — E1142 Type 2 diabetes mellitus with diabetic polyneuropathy: Secondary | ICD-10-CM | POA: Diagnosis not present

## 2018-12-30 DIAGNOSIS — B351 Tinea unguium: Secondary | ICD-10-CM | POA: Diagnosis not present

## 2018-12-30 DIAGNOSIS — M2042 Other hammer toe(s) (acquired), left foot: Secondary | ICD-10-CM

## 2018-12-30 NOTE — Progress Notes (Signed)
Complaint:  Visit Type: Patient returns to my office for continued preventative foot care services. Complaint: Patient states" my nails have grown long and thick and become painful to walk and wear shoes" Patient has been diagnosed with DM with vascular complications.. The patient presents for preventative foot care services. No changes to ROS  Podiatric Exam: Vascular: dorsalis pedis and posterior tibial pulses are weak   bilateral. Capillary return is immediate. Temperature gradient is WNL. Skin turgor WNL  Sensorium: Diminished  Semmes Weinstein monofilament test. Normal tactile sensation bilaterally. Nail Exam: Pt has thick disfigured discolored nails with subungual debris noted bilateral entire nail hallux through fifth toenails Ulcer Exam: There is no evidence of ulcer or pre-ulcerative changes or infection. Orthopedic Exam: Muscle tone and strength are WNL. No limitations in general ROM. No crepitus or effusions noted. Hammer toes 2-4  B/L  Mallet toe 2-4 B/L   Bony prominences are unremarkable. Skin: No Porokeratosis. No infection or ulcers  Diagnosis:  Onychomycosis, , Pain in right toe, pain in left toes  Treatment & Plan Procedures and Treatment: Consent by patient was obtained for treatment procedures. The patient understood the discussion of treatment and procedures well. All questions were answered thoroughly reviewed. Debridement of mycotic and hypertrophic toenails, 1 through 5 bilateral and clearing of subungual debris. No ulceration, no infection noted.   Return Visit-Office Procedure: Patient instructed to return to the office for a follow up visit 10  weeks for continued evaluation and treatment.    Ottavio Norem DPM 

## 2019-01-24 ENCOUNTER — Ambulatory Visit (INDEPENDENT_AMBULATORY_CARE_PROVIDER_SITE_OTHER): Payer: Medicare Other | Admitting: Vascular Surgery

## 2019-01-24 ENCOUNTER — Encounter (INDEPENDENT_AMBULATORY_CARE_PROVIDER_SITE_OTHER): Payer: Medicare Other

## 2019-02-03 ENCOUNTER — Ambulatory Visit (INDEPENDENT_AMBULATORY_CARE_PROVIDER_SITE_OTHER): Payer: Medicare Other | Admitting: Vascular Surgery

## 2019-02-03 ENCOUNTER — Encounter (INDEPENDENT_AMBULATORY_CARE_PROVIDER_SITE_OTHER): Payer: Medicare Other

## 2019-02-28 ENCOUNTER — Ambulatory Visit (INDEPENDENT_AMBULATORY_CARE_PROVIDER_SITE_OTHER): Payer: Medicare Other | Admitting: Vascular Surgery

## 2019-02-28 ENCOUNTER — Ambulatory Visit (INDEPENDENT_AMBULATORY_CARE_PROVIDER_SITE_OTHER): Payer: Medicare Other

## 2019-02-28 ENCOUNTER — Other Ambulatory Visit: Payer: Self-pay

## 2019-02-28 ENCOUNTER — Encounter (INDEPENDENT_AMBULATORY_CARE_PROVIDER_SITE_OTHER): Payer: Self-pay | Admitting: Vascular Surgery

## 2019-02-28 VITALS — BP 115/73 | HR 87 | Resp 16 | Ht 74.0 in

## 2019-02-28 DIAGNOSIS — I2510 Atherosclerotic heart disease of native coronary artery without angina pectoris: Secondary | ICD-10-CM | POA: Diagnosis not present

## 2019-02-28 DIAGNOSIS — Z87891 Personal history of nicotine dependence: Secondary | ICD-10-CM

## 2019-02-28 DIAGNOSIS — Z7902 Long term (current) use of antithrombotics/antiplatelets: Secondary | ICD-10-CM

## 2019-02-28 DIAGNOSIS — I739 Peripheral vascular disease, unspecified: Secondary | ICD-10-CM | POA: Diagnosis not present

## 2019-02-28 DIAGNOSIS — I6523 Occlusion and stenosis of bilateral carotid arteries: Secondary | ICD-10-CM

## 2019-02-28 DIAGNOSIS — Z79899 Other long term (current) drug therapy: Secondary | ICD-10-CM

## 2019-02-28 DIAGNOSIS — Z7982 Long term (current) use of aspirin: Secondary | ICD-10-CM

## 2019-02-28 DIAGNOSIS — I10 Essential (primary) hypertension: Secondary | ICD-10-CM | POA: Diagnosis not present

## 2019-02-28 DIAGNOSIS — J449 Chronic obstructive pulmonary disease, unspecified: Secondary | ICD-10-CM

## 2019-02-28 DIAGNOSIS — E1159 Type 2 diabetes mellitus with other circulatory complications: Secondary | ICD-10-CM

## 2019-03-06 ENCOUNTER — Encounter (INDEPENDENT_AMBULATORY_CARE_PROVIDER_SITE_OTHER): Payer: Self-pay | Admitting: Vascular Surgery

## 2019-03-06 NOTE — Progress Notes (Signed)
MRN : [id number]  [name] is a 85 y.o. (Feb 07, 1933) male who presents with chief complaint of  Chief Complaint  Patient presents with  . Follow-up    ultrasound follow up  .  History of Present Illness:  The patient is seen for follow up evaluation of carotid stenosis. The carotid stenosis followed by ultrasound.   The patient denies amaurosis fugax. There is no recent history of TIA symptoms or focal motor deficits. There is no prior documented CVA.  The patient is taking enteric-coated aspirin 81 mg daily.  There is no history of migraine headaches. There is no history of seizures.  His Parkinson's is getting worse.  The patient has a history of coronary artery disease, no recent episodes of angina or shortness of breath. The patient denies PAD or claudication symptoms. There is a history of hyperlipidemia which is being treated with a statin.   Carotid Duplex done today showsRICA <65% and LICA 99-35%. No change compared to last study in 01/22/2017  Current Meds  Medication Sig  . amLODipine (NORVASC) 10 MG tablet take 1 tablet by mouth daily  . aspirin EC 81 MG tablet Take 81 mg by mouth at bedtime.   Marland Kitchen azelastine (ASTELIN) 0.1 % nasal spray Place into the nose.  . Blood Glucose Monitoring Suppl (ONE TOUCH ULTRA 2) w/Device KIT FPD  . busPIRone (BUSPAR) 15 MG tablet Take 7.5 mg by mouth 2 (two) times daily as needed.  . carbidopa-levodopa (SINEMET IR) 25-100 MG tablet Take 1 tablet by mouth 3 (three) times daily.  . carbidopa-levodopa (SINEMET IR) 25-100 MG tablet Take by mouth.  . cetirizine (ZYRTEC) 5 MG tablet Take by mouth.  . cyanocobalamin 500 MCG tablet Take 1,000 mcg by mouth daily.   Marland Kitchen donepezil (ARICEPT) 5 MG tablet Take 5 mg by mouth daily.  . fluticasone (FLONASE) 50 MCG/ACT nasal spray Place 2 sprays into the nose as needed.   Marland Kitchen losartan (COZAAR) 100 MG tablet Take 100 mg by mouth daily.   Marland Kitchen lovastatin (MEVACOR) 40 MG tablet Take 40 mg by mouth  at bedtime.   . meloxicam (MOBIC) 15 MG tablet take 1 tablet by mouth daily as needed  . metoprolol (LOPRESSOR) 50 MG tablet Take by mouth.  . metoprolol succinate (TOPROL-XL) 25 MG 24 hr tablet TAKE 1 TABLET EVERY DAY  . mirtazapine (REMERON) 30 MG tablet Take by mouth.  . Multiple Vitamin (MULTIVITAMIN WITH MINERALS) TABS tablet Take 1 tablet by mouth daily.  Marland Kitchen nystatin cream (MYCOSTATIN) Apply topically.  Marland Kitchen omeprazole (PRILOSEC) 20 MG capsule Take by mouth.  . ondansetron (ZOFRAN-ODT) 4 MG disintegrating tablet DISSOLVE 1 TABLET ON THE TONGUE EVERY 8 HRS FOR UP TO 3 DAY AS NEEDED FOR NAUSEA OR VOMITING  . ONE TOUCH ULTRA TEST test strip USE TO TEST BLOOD SUGAR QD  . ONETOUCH DELICA LANCETS 70V MISC USE TO TEST BLOOD SUGAR QD  . polyethylene glycol (MIRALAX / GLYCOLAX) packet Take 17 g by mouth daily as needed for mild constipation.  . sertraline (ZOLOFT) 50 MG tablet Take 50 mg by mouth daily.    Past Medical History:  Diagnosis Date  . Asthma   . CAD (coronary artery disease)    Status post left circumflex stent  . CKD (chronic kidney disease) stage 3, GFR 30-59 ml/min (HCC)   . COPD (chronic obstructive pulmonary disease) ([place])   . 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 Diabetes mellitus   . Hypertension   .  Myocardial infarction ([place])   . OSA (obstructive sleep apnea)   . Parkinson's disease ([place])   . Shortness of breath   . Stroke [facility])     Past Surgical History:  Procedure Laterality Date  . CARDIAC CATHETERIZATION    . COLONOSCOPY WITH PROPOFOL N/A 10/27/2016   Procedure: COLONOSCOPY WITH PROPOFOL;  Surgeon: Manya Silvas, MD;  Location: [facility] ENDOSCOPY;  Service: Endoscopy;  Laterality: N/A;  . CORONARY ARTERY BYPASS GRAFT    . EYE SURGERY    . NASAL SINUS SURGERY      Social History Social History   Tobacco Use  . Smoking status: Former Smoker    Packs/day: 2.00    Years: 50.00    Pack years: 100.00    Types: Cigarettes    Last  attempt to quit: 12/18/2005    Years since quitting: 13.2  . Smokeless tobacco: Never Used  . Tobacco comment: Quit about 12 years ago  Substance Use Topics  . Alcohol use: No  . Drug use: No    Family History Family History  Problem Relation Age of Onset  . Heart disease Father   . Cancer Father     Allergies  Allergen Reactions  . Donepezil Nausea Only  . Atorvastatin Rash and Other (See Comments)    myalgia     REVIEW OF SYSTEMS (Negative unless checked)  Constitutional: []Weight loss  []Fever  []Chills Cardiac: []Chest pain   []Chest pressure   []Palpitations   []Shortness of breath when laying flat   []Shortness of breath with exertion. Vascular:  [x]Pain in legs with walking   []Pain in legs at rest  []History of DVT   []Phlebitis   []Swelling in legs   []Varicose veins   []Non-healing ulcers Pulmonary:   []Uses home oxygen   []Productive cough   []Hemoptysis   []Wheeze  []COPD   []Asthma Neurologic:  []Dizziness   []Seizures   []History of stroke   []History of TIA  []Aphasia   []Vissual changes   []Weakness or numbness in arm   []Weakness or numbness in leg Musculoskeletal:   []Joint swelling   []Joint pain   []Low back pain Hematologic:  []Easy bruising  []Easy bleeding   []Hypercoagulable state   []Anemic Gastrointestinal:  []Diarrhea   []Vomiting  []Gastroesophageal reflux/heartburn   []Difficulty swallowing. Genitourinary:  []Chronic kidney disease   []Difficult urination  []Frequent urination   []Blood in urine Skin:  []Rashes   []Ulcers  Psychological:  []History of anxiety   [] History of major depression.  Physical Examination  Vitals:   02/28/19 1050  BP: 115/73  Pulse: 87  Resp: 16  Height: 6' 2" (1.88 m)   Body mass index is 21.65 kg/m. Gen: WD/WN, NAD Head: [place]/AT, No temporalis wasting.  Ear/Nose/Throat: Hearing grossly intact, nares w/o erythema or drainage Eyes: PER, EOMI, sclera nonicteric.  Neck: Supple, no large masses.   Pulmonary:  Good  air movement, no audible wheezing bilaterally, no use of accessory muscles.  Cardiac: RRR, no JVD Vascular: bilateral carotid bruits Vessel Right Left  Radial Palpable Palpable  Brachial Palpable Palpable  Carotid Palpable Palpable  PT Not Palpable Not Palpable  DP Not Palpable Not Palpable  Gastrointestinal: Non-distended. No guarding/no peritoneal signs.  Musculoskeletal: M/S 5/5 throughout.  No deformity or atrophy.  Neurologic: CN 2-12 intact. Symmetrical.  Speech is fluent. Motor exam as listed above. Psychiatric: Judgment intact, Mood & affect appropriate for pt's clinical situation. Dermatologic: No rashes or ulcers noted.  No changes consistent with cellulitis. Lymph : No lichenification or skin changes of chronic lymphedema.  CBC Lab Results  Component Value Date   WBC 4.3 10/27/2016   HGB 12.4 (L) 10/27/2016   HCT 35.2 (L) 10/27/2016   MCV 92.3 10/27/2016   PLT 142 (L) 10/27/2016    BMET    Component Value Date/Time   NA 135 10/26/2016 0327   NA 137 04/29/2014 1254   K 4.1 10/26/2016 0327   K 4.8 04/29/2014 1254   CL 104 10/26/2016 0327   CL 104 04/29/2014 1254   CO2 23 10/26/2016 0327   CO2 26 04/29/2014 1254   GLUCOSE 121 (H) 10/26/2016 0327   GLUCOSE 125 (H) 04/29/2014 1254   BUN 19 10/26/2016 0327   BUN 16 04/29/2014 1254   CREATININE 1.38 (H) 10/26/2016 0327   CREATININE 1.38 (H) 04/29/2014 1254   CALCIUM 8.8 (L) 10/26/2016 0327   CALCIUM 9.4 04/29/2014 1254   GFRNONAA 46 (L) 10/26/2016 0327   GFRNONAA 49 (L) 04/29/2014 1254   GFRAA 54 (L) 10/26/2016 0327   GFRAA 56 (L) 04/29/2014 1254   CrCl cannot be calculated (Patient's most recent lab result is older than the maximum 21 days allowed.).  COAG Lab Results  Component Value Date   INR 1.04 10/26/2016   INR 1.04 10/25/2016   INR 1.03 06/03/2012    Radiology Vas US Carotid  Result Date: 02/28/2019 Carotid Arterial Duplex Study Indications: Carotid artery disease. Performing Technologist:  Blondell Reveal RT, RDMS, RVT  Examination Guidelines: A complete evaluation includes B-mode imaging, spectral Doppler, color Doppler, and power Doppler as needed of all accessible portions of each vessel. Bilateral testing is considered an integral part of a complete examination. Limited examinations for reoccurring indications may be performed as noted.  Right Carotid Findings: +----------+--------+--------+--------+------------+------------------+           PSV cm/sEDV cm/sStenosisDescribe    Comments           +----------+--------+--------+--------+------------+------------------+ CCA Prox  63      0                                              +----------+--------+--------+--------+------------+------------------+ CCA Mid   70      0                                              +----------+--------+--------+--------+------------+------------------+ CCA Distal46      0                           intimal thickening +----------+--------+--------+--------+------------+------------------+ ICA Prox  56      9       1-39%   heterogenousICA/CCA ratio=0.8  +----------+--------+--------+--------+------------+------------------+ ICA Mid   73      11                                             +----------+--------+--------+--------+------------+------------------+ ICA Distal62      8                                              +----------+--------+--------+--------+------------+------------------+  ECA       67      0                                              +----------+--------+--------+--------+------------+------------------+ +----------+--------+-------+----------------+-------------------+           PSV cm/sEDV cmsDescribe        Arm Pressure (mmHG) +----------+--------+-------+----------------+-------------------+ [id number]             Multiphasic, WNL                    +----------+--------+-------+----------------+-------------------+  +---------+--------+--+--------+---------+ VertebralPSV cm/s50EDV cm/sAntegrade +---------+--------+--+--------+---------+  Left Carotid Findings: +----------+--------+--------+--------+--------+-----------------+           PSV cm/sEDV cm/sStenosisDescribeComments          +----------+--------+--------+--------+--------+-----------------+ CCA Prox  93      11                                        +----------+--------+--------+--------+--------+-----------------+ CCA Mid   67      10                                        +----------+--------+--------+--------+--------+-----------------+ CCA Distal68      10                                        +----------+--------+--------+--------+--------+-----------------+ ICA Prox  165     22      40-59%  calcificICA/CCA ratio=2.5 +----------+--------+--------+--------+--------+-----------------+ ICA Mid   85      16                                        +----------+--------+--------+--------+--------+-----------------+ ICA Distal54      12                                        +----------+--------+--------+--------+--------+-----------------+ ECA       193     21      >50%    calcific                  +----------+--------+--------+--------+--------+-----------------+ +----------+--------+--------+----------------+-------------------+ SubclavianPSV cm/sEDV cm/sDescribe        Arm Pressure (mmHG) +----------+--------+--------+----------------+-------------------+           118             Multiphasic, WNL                    +----------+--------+--------+----------------+-------------------+ +---------+--------+--+--------+---------+ VertebralPSV cm/s42EDV cm/sAntegrade +---------+--------+--+--------+---------+  Summary: Right Carotid: Velocities in the right ICA are consistent with a 1-39% stenosis.                The extracranial vessels were near-normal with only minimal wall                 thickening or plaque. Left Carotid: Velocities in the left ICA are consistent with a 40-59% stenosis.  The ECA appears >50% stenosed.                No significant change when compared to the previous exam on               01/25/18. *See table(s) above for measurements and observations.  Electronically signed by Hortencia Pilar MD on 02/28/2019 at 5:06:01 PM.    Final      Assessment/Plan 1. Bilateral carotid artery stenosis Recommend:  Given the patient's asymptomatic subcritical stenosis no further invasive testing or surgery at this time.  Duplex ultrasound showsstable subcriticalstenosis bilaterally.  Continue antiplatelet therapy as prescribed Continue management of CAD, HTN and Hyperlipidemia Healthy heart diet, encouraged exercise at least 4 times per week Follow up in22month with duplex ultrasound and physical exam based on the [id number]>51%stenosis of theLICAcarotid artery - VAS [place]CAROTID; Future  2. Peripheral vascular disease ([place]  Recommend:  The patient has evidence of atherosclerosis of the lower extremities with claudication.  The patient does not voice lifestyle limiting changes at this point in time.  Noninvasive studies do not suggest clinically significant change.  No invasive studies, angiography or surgery at this time The patient should continue walking and begin a more formal exercise program.  The patient should continue antiplatelet therapy and aggressive treatment of the lipid abnormalities  No changes in the patient's medications at this time  The patient should continue wearing graduated compression socks 10-15 mmHg strength to control the mild edema.    3. Arteriosclerosis of coronary artery Continue cardiac and antihypertensive medications as already ordered and reviewed, no changes at this time.  Continue statin as ordered and reviewed, no changes at this time  Nitrates PRN for chest pain   4. Essential hypertension,  benign Continue antihypertensive medications as already ordered, these medications have been reviewed and there are no changes at this time.   5. COPD Continue pulmonary medications and aerosols as already ordered, these medications have been reviewed and there are no changes at this time.    6. Type 2 diabetes mellitus with other circulatory complication, without long-term current use of insulin (HCC) Continue hypoglycemic medications as already ordered, these medications have been reviewed and there are no changes at this time.  Hgb A1C to be monitored as already arranged by primary service     GHortencia Pilar MD  03/06/2019 2:39 PM

## 2019-03-10 ENCOUNTER — Ambulatory Visit: Payer: Medicare Other | Admitting: Podiatry

## 2019-03-14 ENCOUNTER — Other Ambulatory Visit: Payer: Self-pay

## 2019-03-14 ENCOUNTER — Encounter: Payer: Self-pay | Admitting: Podiatry

## 2019-03-14 ENCOUNTER — Ambulatory Visit: Payer: Medicare Other | Admitting: Podiatry

## 2019-03-14 ENCOUNTER — Ambulatory Visit (INDEPENDENT_AMBULATORY_CARE_PROVIDER_SITE_OTHER): Payer: Medicare Other | Admitting: Podiatry

## 2019-03-14 DIAGNOSIS — M79676 Pain in unspecified toe(s): Secondary | ICD-10-CM | POA: Diagnosis not present

## 2019-03-14 DIAGNOSIS — B351 Tinea unguium: Secondary | ICD-10-CM | POA: Diagnosis not present

## 2019-03-14 DIAGNOSIS — M2042 Other hammer toe(s) (acquired), left foot: Secondary | ICD-10-CM

## 2019-03-14 DIAGNOSIS — M2041 Other hammer toe(s) (acquired), right foot: Secondary | ICD-10-CM

## 2019-03-14 DIAGNOSIS — E1142 Type 2 diabetes mellitus with diabetic polyneuropathy: Secondary | ICD-10-CM

## 2019-03-14 NOTE — Progress Notes (Signed)
Complaint:  Visit Type: Patient returns to my office for continued preventative foot care services. Complaint: Patient states" my nails have grown long and thick and become painful to walk and wear shoes" Patient has been diagnosed with DM with vascular complications.. The patient presents for preventative foot care services. No changes to ROS  Podiatric Exam: Vascular: dorsalis pedis and posterior tibial pulses are weak   bilateral. Capillary return is immediate. Temperature gradient is WNL. Skin turgor WNL  Sensorium: Diminished  Semmes Weinstein monofilament test. Normal tactile sensation bilaterally. Nail Exam: Pt has thick disfigured discolored nails with subungual debris noted bilateral entire nail hallux through fifth toenails Ulcer Exam: There is no evidence of ulcer or pre-ulcerative changes or infection. Orthopedic Exam: Muscle tone and strength are WNL. No limitations in general ROM. No crepitus or effusions noted. Hammer toes 2-4  B/L  Mallet toe 2-4 B/L   Bony prominences are unremarkable. Skin: No Porokeratosis. No infection or ulcers  Diagnosis:  Onychomycosis, , Pain in right toe, pain in left toes  Treatment & Plan Procedures and Treatment: Consent by patient was obtained for treatment procedures. The patient understood the discussion of treatment and procedures well. All questions were answered thoroughly reviewed. Debridement of mycotic and hypertrophic toenails, 1 through 5 bilateral and clearing of subungual debris. No ulceration, no infection noted.   Return Visit-Office Procedure: Patient instructed to return to the office for a follow up visit 10  weeks for continued evaluation and treatment.    Ronnie Mallette DPM 

## 2019-04-21 ENCOUNTER — Ambulatory Visit: Payer: Medicare Other | Admitting: Podiatry

## 2019-05-23 ENCOUNTER — Other Ambulatory Visit: Payer: Self-pay

## 2019-05-23 ENCOUNTER — Encounter: Payer: Self-pay | Admitting: Podiatry

## 2019-05-23 ENCOUNTER — Ambulatory Visit (INDEPENDENT_AMBULATORY_CARE_PROVIDER_SITE_OTHER): Payer: Medicare Other | Admitting: Podiatry

## 2019-05-23 VITALS — Temp 98.0°F

## 2019-05-23 DIAGNOSIS — E1142 Type 2 diabetes mellitus with diabetic polyneuropathy: Secondary | ICD-10-CM

## 2019-05-23 DIAGNOSIS — B351 Tinea unguium: Secondary | ICD-10-CM

## 2019-05-23 DIAGNOSIS — M79676 Pain in unspecified toe(s): Secondary | ICD-10-CM | POA: Diagnosis not present

## 2019-05-23 NOTE — Progress Notes (Signed)
Complaint:  Visit Type: Patient returns to my office for continued preventative foot care services. Complaint: Patient states" my nails have grown long and thick and become painful to walk and wear shoes" Patient has been diagnosed with DM with vascular complications.. The patient presents for preventative foot care services. No changes to ROS  Podiatric Exam: Vascular: dorsalis pedis and posterior tibial pulses are weak   bilateral. Capillary return is immediate. Temperature gradient is WNL. Skin turgor WNL  Sensorium: Diminished  Semmes Weinstein monofilament test. Normal tactile sensation bilaterally. Nail Exam: Pt has thick disfigured discolored nails with subungual debris noted bilateral entire nail hallux through fifth toenails Ulcer Exam: There is no evidence of ulcer or pre-ulcerative changes or infection. Orthopedic Exam: Muscle tone and strength are WNL. No limitations in general ROM. No crepitus or effusions noted. Hammer toes 2-4  B/L  Mallet toe 2-4 B/L   Bony prominences are unremarkable. Skin: No Porokeratosis. No infection or ulcers  Diagnosis:  Onychomycosis, , Pain in right toe, pain in left toes  Treatment & Plan Procedures and Treatment: Consent by patient was obtained for treatment procedures. The patient understood the discussion of treatment and procedures well. All questions were answered thoroughly reviewed. Debridement of mycotic and hypertrophic toenails, 1 through 5 bilateral and clearing of subungual debris. No ulceration, no infection noted.   Return Visit-Office Procedure: Patient instructed to return to the office for a follow up visit 10  weeks for continued evaluation and treatment.    Sharolyn Weber DPM 

## 2019-08-01 ENCOUNTER — Other Ambulatory Visit: Payer: Self-pay

## 2019-08-01 ENCOUNTER — Encounter: Payer: Self-pay | Admitting: Podiatry

## 2019-08-01 ENCOUNTER — Ambulatory Visit (INDEPENDENT_AMBULATORY_CARE_PROVIDER_SITE_OTHER): Payer: Medicare Other | Admitting: Podiatry

## 2019-08-01 VITALS — Temp 98.3°F

## 2019-08-01 DIAGNOSIS — B351 Tinea unguium: Secondary | ICD-10-CM

## 2019-08-01 DIAGNOSIS — E1142 Type 2 diabetes mellitus with diabetic polyneuropathy: Secondary | ICD-10-CM | POA: Diagnosis not present

## 2019-08-01 DIAGNOSIS — M79676 Pain in unspecified toe(s): Secondary | ICD-10-CM

## 2019-08-01 NOTE — Progress Notes (Signed)
Complaint:  Visit Type: Patient returns to my office for continued preventative foot care services. Complaint: Patient states" my nails have grown long and thick and become painful to walk and wear shoes" Patient has been diagnosed with DM with vascular complications.. The patient presents for preventative foot care services. No changes to ROS  Podiatric Exam: Vascular: dorsalis pedis and posterior tibial pulses are weak   bilateral. Capillary return is immediate. Temperature gradient is WNL. Skin turgor WNL  Sensorium: Diminished  Semmes Weinstein monofilament test. Normal tactile sensation bilaterally. Nail Exam: Pt has thick disfigured discolored nails with subungual debris noted bilateral entire nail hallux through fifth toenails Ulcer Exam: There is no evidence of ulcer or pre-ulcerative changes or infection. Orthopedic Exam: Muscle tone and strength are WNL. No limitations in general ROM. No crepitus or effusions noted. Hammer toes 2-4  B/L  Mallet toe 2-4 B/L   Bony prominences are unremarkable. Skin: No Porokeratosis. No infection or ulcers  Diagnosis:  Onychomycosis, , Pain in right toe, pain in left toes  Treatment & Plan Procedures and Treatment: Consent by patient was obtained for treatment procedures. The patient understood the discussion of treatment and procedures well. All questions were answered thoroughly reviewed. Debridement of mycotic and hypertrophic toenails, 1 through 5 bilateral and clearing of subungual debris. No ulceration, no infection noted.   Return Visit-Office Procedure: Patient instructed to return to the office for a follow up visit 10  weeks for continued evaluation and treatment.    Alton Tremblay DPM 

## 2019-09-05 ENCOUNTER — Telehealth: Payer: Self-pay | Admitting: Hospice

## 2019-09-05 NOTE — Telephone Encounter (Signed)
Spoke with patient's wife Blanch Media regarding Palliative services and she was in agreement with this.  I have scheduled a [facility] for 09/12/19 @ 2 PM.

## 2019-09-08 ENCOUNTER — Telehealth: Payer: Self-pay | Admitting: Hospice

## 2019-09-08 NOTE — Telephone Encounter (Signed)
Rec'd call from patient's wife and she wanted to cancel the referral and Palliative services.  She said reason was because she couldn't get the zoom set up and I offered her a regular telephone call or I told her that we could come to the home and she declined.  Will notify MD office.

## 2019-09-12 ENCOUNTER — Other Ambulatory Visit: Payer: Self-pay | Admitting: Hospice

## 2019-10-10 ENCOUNTER — Ambulatory Visit (INDEPENDENT_AMBULATORY_CARE_PROVIDER_SITE_OTHER): Payer: Medicare Other | Admitting: Podiatry

## 2019-10-10 ENCOUNTER — Other Ambulatory Visit: Payer: Self-pay

## 2019-10-10 ENCOUNTER — Encounter: Payer: Self-pay | Admitting: Podiatry

## 2019-10-10 DIAGNOSIS — E1142 Type 2 diabetes mellitus with diabetic polyneuropathy: Secondary | ICD-10-CM

## 2019-10-10 DIAGNOSIS — M79676 Pain in unspecified toe(s): Secondary | ICD-10-CM

## 2019-10-10 DIAGNOSIS — M2042 Other hammer toe(s) (acquired), left foot: Secondary | ICD-10-CM

## 2019-10-10 DIAGNOSIS — M2041 Other hammer toe(s) (acquired), right foot: Secondary | ICD-10-CM

## 2019-10-10 DIAGNOSIS — B351 Tinea unguium: Secondary | ICD-10-CM

## 2019-10-10 NOTE — Progress Notes (Signed)
Complaint:  Visit Type: Patient returns to my office for continued preventative foot care services. Complaint: Patient states" my nails have grown long and thick and become painful to walk and wear shoes" Patient has been diagnosed with DM with vascular complications.. The patient presents for preventative foot care services. No changes to ROS  Podiatric Exam: Vascular: dorsalis pedis and posterior tibial pulses are weak   bilateral. Capillary return is immediate. Temperature gradient is WNL. Skin turgor WNL  Sensorium: Diminished  Semmes Weinstein monofilament test. Normal tactile sensation bilaterally. Nail Exam: Pt has thick disfigured discolored nails with subungual debris noted bilateral entire nail hallux through fifth toenails Ulcer Exam: There is no evidence of ulcer or pre-ulcerative changes or infection. Orthopedic Exam: Muscle tone and strength are WNL. No limitations in general ROM. No crepitus or effusions noted. Hammer toes 2-4  B/L  Mallet toe 2-4 B/L   Bony prominences are unremarkable. Skin: No Porokeratosis. No infection or ulcers  Diagnosis:  Onychomycosis, , Pain in right toe, pain in left toes  Treatment & Plan Procedures and Treatment: Consent by patient was obtained for treatment procedures. The patient understood the discussion of treatment and procedures well. All questions were answered thoroughly reviewed. Debridement of mycotic and hypertrophic toenails, 1 through 5 bilateral and clearing of subungual debris. No ulceration, no infection noted.   Return Visit-Office Procedure: Patient instructed to return to the office for a follow up visit 10  weeks for continued evaluation and treatment.    Lailany Enoch DPM 

## 2019-10-31 ENCOUNTER — Other Ambulatory Visit: Payer: Self-pay | Admitting: Ophthalmology

## 2019-10-31 ENCOUNTER — Other Ambulatory Visit (HOSPITAL_COMMUNITY): Payer: Self-pay | Admitting: Ophthalmology

## 2019-10-31 DIAGNOSIS — H02402 Unspecified ptosis of left eyelid: Secondary | ICD-10-CM

## 2019-10-31 DIAGNOSIS — H4922 Sixth [abducent] nerve palsy, left eye: Secondary | ICD-10-CM

## 2019-11-09 ENCOUNTER — Ambulatory Visit: Payer: Medicare Other

## 2019-11-24 ENCOUNTER — Ambulatory Visit: Payer: Medicare Other

## 2019-11-29 ENCOUNTER — Telehealth: Payer: Self-pay | Admitting: Hospice

## 2019-11-29 NOTE — Telephone Encounter (Signed)
Spoke with patient's wife Blanch Media regarding Palliative services and she was in agreement with this.  I have scheduled a Telephone Consult for 12/01/19 @ 3 PM

## 2019-12-01 ENCOUNTER — Other Ambulatory Visit: Payer: Medicare Other | Admitting: Hospice

## 2019-12-01 ENCOUNTER — Other Ambulatory Visit: Payer: Self-pay

## 2019-12-01 ENCOUNTER — Telehealth: Payer: Self-pay | Admitting: Hospice

## 2019-12-01 DIAGNOSIS — G309 Alzheimer's disease, unspecified: Secondary | ICD-10-CM

## 2019-12-01 DIAGNOSIS — F0150 Vascular dementia without behavioral disturbance: Secondary | ICD-10-CM

## 2019-12-01 DIAGNOSIS — Z515 Encounter for palliative care: Secondary | ICD-10-CM

## 2019-12-01 NOTE — Telephone Encounter (Signed)
[place] from Dr. Olin Pia office called back and said they would work on getting him PT unrelated Home health services and she would call patient's spouse for update.

## 2019-12-01 NOTE — Progress Notes (Signed)
[facility] Consult Note Telephone: [phone]  Fax: [phone]  PATIENT NAME: [name] DOB: December 23, 1932 MRN: [id number]  PRIMARY CARE PROVIDER:   Adin Hector, MD  REFERRING PROVIDER:  Adin Hector, MD [address] [place],  [place] [address]  RESPONSIBLE PARTY:  Emergency contact. Spouse- Blanch Media   TELEHEALTH VISIT STATEMENT Due to the COVID-19 crisis, this visit was done via telephone from my office. It was initiated and consented to by this patient and/or family.  RECOMMENDATIONS/PLAN:   Advance Care Planning/Goals of Care: Telehealth Visit consisted of building trust and discussions on Palliative Medicine as specialized medical care for people living with serious illness, aimed at facilitating better quality of life through symptoms relief, assisting with advance care plan and establishing goals of care. Goals of care include to maximize quality of life and symptom management. Spouse agreed to face to face visit next week to further discuss.  Symptom management: Increasing weakness and inability to get out of his couch as before possibly related to new medication Tramadol, per spouse. Patient requiring more assistance with transfers and ADLs; spouse said she had recent back surgery and worried she might hurt herself.  She said she has been backing off giving him Tramadol every 8 hours as ordered. Advised that she could give him Tylenol 1000 mg during the day and give the Tramadol at night. Ongoing memory loss r/t to Dementia. FAST 7a. Encouraged ongoing supportive care. She said patient is to start PT next week and she really needs home health services at home.  Called PCP's office and message left for PCP that patient needs Home health services. Follow up: Palliative care will continue to follow patient for goals of care clarification and symptom management. I spent  30 minutes providing this  consultation, from 3pm to 3.30pm.  More than 50% of the time in this consultation was spent on coordinating communication  HISTORY OF PRESENT ILLNESS:  [name] is a 85 y.o. year old male with multiple medical problems COPD HTN Left rotator cuff tear, Dementia. Palliative Care was asked to help address goals of care.   CODE STATUS: DNR  PPS: 30% HOSPICE ELIGIBILITY/DIAGNOSIS: TBD  PAST MEDICAL HISTORY:  Past Medical History:  Diagnosis Date  . Asthma   . CAD (coronary artery disease)    Status post left circumflex stent  . CKD (chronic kidney disease) stage 3, GFR 30-59 ml/min   . COPD (chronic obstructive pulmonary disease) ([place])   . 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 Diabetes mellitus   . Hypertension   . Myocardial infarction ([place])   . OSA (obstructive sleep apnea)   . Parkinson's disease ([place])   . Shortness of breath   . Stroke [facility])     SOCIAL HX:  Social History   Tobacco Use  . Smoking status: Former Smoker    Packs/day: 2.00    Years: 50.00    Pack years: 100.00    Types: Cigarettes    Quit date: 12/18/2005    Years since quitting: 13.9  . Smokeless tobacco: Never Used  . Tobacco comment: Quit about 12 years ago  Substance Use Topics  . Alcohol use: No    ALLERGIES:  Allergies  Allergen Reactions  . Donepezil Nausea Only  . Atorvastatin Rash and Other (See Comments)    myalgia     PERTINENT MEDICATIONS:  Outpatient Encounter Medications as of 12/01/2019  Medication Sig  . amLODipine (NORVASC) 10 MG tablet take 1 tablet by mouth daily  . amLODipine (NORVASC) 5 MG tablet Take 5 mg by mouth daily.  Marland Kitchen aspirin EC 81 MG tablet Take 81 mg by mouth at bedtime.   . Blood Glucose Monitoring Suppl (ONE TOUCH ULTRA 2) w/Device KIT FPD  . busPIRone (BUSPAR) 15 MG tablet Take 7.5 mg by mouth 2 (two) times daily as needed.  . carbidopa-levodopa (SINEMET IR) 25-100 MG tablet Take 1 tablet by mouth 3 (three) times daily.  . cetirizine  (ZYRTEC) 10 MG tablet TAKE 1/2 TABLET BY MOUTH ONCE A DAY  . cyanocobalamin 500 MCG tablet Take 1,000 mcg by mouth daily.   Marland Kitchen donepezil (ARICEPT) 5 MG tablet Take 5 mg by mouth daily.  . fluticasone (FLONASE) 50 MCG/ACT nasal spray Place 2 sprays into the nose as needed.   . Lancets (ONETOUCH DELICA PLUS [id number]) MISC USE TO CHECK BLOOD SUGAR ONCE A DAY AS DIRECTED. E11.8  . levothyroxine (SYNTHROID, LEVOTHROID) 25 MCG tablet TAKE 1 TAB BY MOUTH ONCE DAILY ON EMPTY STOMACH WITH GLASS OF WATER AT LEAST 30-60 MINS BEFORE BFAST  . losartan (COZAAR) 100 MG tablet Take 100 mg by mouth daily.   Marland Kitchen lovastatin (MEVACOR) 40 MG tablet Take 40 mg by mouth at bedtime.   . meloxicam (MOBIC) 15 MG tablet take 1 tablet by mouth daily as needed  . metoprolol (LOPRESSOR) 50 MG tablet Take by mouth.  . metoprolol succinate (TOPROL-XL) 25 MG 24 hr tablet TAKE 1 TABLET EVERY DAY  . mirtazapine (REMERON) 30 MG tablet Take by mouth.  . Multiple Vitamin (MULTIVITAMIN WITH MINERALS) TABS tablet Take 1 tablet by mouth daily.  Marland Kitchen nystatin cream (MYCOSTATIN) Apply topically.  Marland Kitchen omeprazole (PRILOSEC) 20 MG capsule Take by mouth.  . ondansetron (ZOFRAN-ODT) 4 MG disintegrating tablet DISSOLVE 1 TABLET ON THE TONGUE EVERY 8 HRS FOR UP TO 3 DAY AS NEEDED FOR NAUSEA OR VOMITING  . ONE TOUCH ULTRA TEST test strip USE TO TEST BLOOD SUGAR QD  . polyethylene glycol (MIRALAX / GLYCOLAX) packet Take 17 g by mouth daily as needed for mild constipation.  . sertraline (ZOLOFT) 25 MG tablet Take by mouth.   No facility-administered encounter medications on file as of 12/01/2019.    PHYSICAL EXAM:   General: NAD, frail appearing, thin Cardiovascular: regular rate and rhythm Pulmonary: clear ant fields Abdomen: soft, nontender, + bowel sounds GU: no suprapubic tenderness Extremities: no edema, no joint deformities Skin: no rashes Neurological: Weakness but otherwise nonfocal  Teodoro Spray, NP

## 2019-12-07 ENCOUNTER — Other Ambulatory Visit: Payer: Self-pay

## 2019-12-07 ENCOUNTER — Other Ambulatory Visit: Payer: Medicare Other | Admitting: Hospice

## 2019-12-07 DIAGNOSIS — F0150 Vascular dementia without behavioral disturbance: Secondary | ICD-10-CM

## 2019-12-07 DIAGNOSIS — G309 Alzheimer's disease, unspecified: Secondary | ICD-10-CM

## 2019-12-07 DIAGNOSIS — Z515 Encounter for palliative care: Secondary | ICD-10-CM

## 2019-12-07 NOTE — Progress Notes (Signed)
  [facility] Palliative [facility] Consult Note Telephone: [phone]  Fax: [phone]  PATIENT NAME: [name] DOB: 07/04/1933 MRN: [id number]  PRIMARY CARE PROVIDER:   Klein, Bert J III, MD  REFERRING PROVIDER:  Klein, Bert J III, MD [address] [facility] [place],  [place] [address]  RESPONSIBLE PARTY:  Emergency contact. Spouse- Joyce [phone]   RECOMMENDATIONS/PLAN:   Advance Care Planning/Goals of Care: Patient is a DNR; DNR at home. A copy uploaded in Epic today. Goals of care include to maximize quality of life and symptom management. Spouse not home during visit. Mike, at home, reports spouse had a fall and was taken by EMS to the ED for evaluation of her right wrist.  Symptom management:  Ongoing memory loss and confusion related to Dementia.  FAST 6b. Patient is pleasant, able to answer simple questions directed at him; denied pain/discomfort. FLAAC 0. He ambulates within the house with a rolling walker. Mike said 'he has perked up in the last few days'. PT/OT ongoing for strengthening. Ongoing supportive care encouraged. Follow up: Palliative care will continue to follow patient for goals of care clarification and symptom management. I spent 45 minutes providing this consultation,  from 3 pm to 3.45pm.  More than 50% of the time in this consultation was spent coordinating communication HISTORY OF PRESENT ILLNESS:  [name] is a 85 y.o. year old male with multiple medical problems COPD HTN Left rotator cuff tear, Dementia. Palliative Care was asked to help address goals of care.  CODE STATUS: DNR  PPS: 40% HOSPICE ELIGIBILITY/DIAGNOSIS: TBD  PAST MEDICAL HISTORY:  Past Medical History:  Diagnosis Date  . Asthma   . CAD (coronary artery disease)    Status post left circumflex stent  . CKD (chronic kidney disease) stage 3, GFR 30-59 ml/min   . COPD (chronic obstructive pulmonary disease) ([facility])   .  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 Diabetes mellitus   . Hypertension   . Myocardial infarction ([facility])   . OSA (obstructive sleep apnea)   . Parkinson's disease ([facility])   . Shortness of breath   . Stroke ([facility])     SOCIAL HX:  Social History   Tobacco Use  . Smoking status: Former Smoker    Packs/day: 2.00    Years: 50.00    Pack years: 100.00    Types: Cigarettes    Quit date: 12/18/2005    Years since quitting: 13.9  . Smokeless tobacco: Never Used  . Tobacco comment: Quit about 12 years ago  Substance Use Topics  . Alcohol use: No    ALLERGIES:  Allergies  Allergen Reactions  . Donepezil Nausea Only  . Atorvastatin Rash and Other (See Comments)    myalgia     PERTINENT MEDICATIONS:  Outpatient Encounter Medications as of 12/07/2019  Medication Sig  . amLODipine (NORVASC) 10 MG tablet take 1 tablet by mouth daily  . amLODipine (NORVASC) 5 MG tablet Take 5 mg by mouth daily.  . aspirin EC 81 MG tablet Take 81 mg by mouth at bedtime.   . Blood Glucose Monitoring Suppl (ONE TOUCH ULTRA 2) w/Device KIT FPD  . busPIRone (BUSPAR) 15 MG tablet Take 7.5 mg by mouth 2 (two) times daily as needed.  . carbidopa-levodopa (SINEMET IR) 25-100 MG tablet Take 1 tablet by mouth 3 (three) times daily.  . cetirizine (ZYRTEC) 10 MG tablet TAKE 1/2 TABLET   BY MOUTH ONCE A DAY  . cyanocobalamin 500 MCG tablet Take 1,000 mcg by mouth daily.   Marland Kitchen donepezil (ARICEPT) 5 MG tablet Take 5 mg by mouth daily.  . fluticasone (FLONASE) 50 MCG/ACT nasal spray Place 2 sprays into the nose as needed.   . Lancets (ONETOUCH DELICA PLUS [id number]) MISC USE TO CHECK BLOOD SUGAR ONCE A DAY AS DIRECTED. E11.8  . levothyroxine (SYNTHROID, LEVOTHROID) 25 MCG tablet TAKE 1 TAB BY MOUTH ONCE DAILY ON EMPTY STOMACH WITH GLASS OF WATER AT LEAST 30-60 MINS BEFORE BFAST  . losartan (COZAAR) 100 MG tablet Take 100 mg by mouth daily.   Marland Kitchen lovastatin (MEVACOR) 40 MG tablet Take 40 mg by mouth at  bedtime.   . meloxicam (MOBIC) 15 MG tablet take 1 tablet by mouth daily as needed  . metoprolol (LOPRESSOR) 50 MG tablet Take by mouth.  . metoprolol succinate (TOPROL-XL) 25 MG 24 hr tablet TAKE 1 TABLET EVERY DAY  . mirtazapine (REMERON) 30 MG tablet Take by mouth.  . Multiple Vitamin (MULTIVITAMIN WITH MINERALS) TABS tablet Take 1 tablet by mouth daily.  Marland Kitchen nystatin cream (MYCOSTATIN) Apply topically.  Marland Kitchen omeprazole (PRILOSEC) 20 MG capsule Take by mouth.  . ondansetron (ZOFRAN-ODT) 4 MG disintegrating tablet DISSOLVE 1 TABLET ON THE TONGUE EVERY 8 HRS FOR UP TO 3 DAY AS NEEDED FOR NAUSEA OR VOMITING  . ONE TOUCH ULTRA TEST test strip USE TO TEST BLOOD SUGAR QD  . polyethylene glycol (MIRALAX / GLYCOLAX) packet Take 17 g by mouth daily as needed for mild constipation.  . sertraline (ZOLOFT) 25 MG tablet Take by mouth.   No facility-administered encounter medications on file as of 12/07/2019.    PHYSICAL EXAM/ROS:   General: Cooperative, in no acute distress Cardiovascular: regular rate and rhythm; denies chest pain/discomfort Pulmonary: clear ant fields; no coughing, no SOB; normal respiratory effort;  02 saturation 99% RA Abdomen: soft, nontender, + bowel sounds Extremities: no edema, no joint deformities Skin: no rashes to exposed skin Neurological: Weakness; oriented to person and place.  Teodoro Spray, NP

## 2019-12-29 ENCOUNTER — Other Ambulatory Visit: Payer: Medicare Other | Admitting: Hospice

## 2019-12-29 ENCOUNTER — Other Ambulatory Visit: Payer: Self-pay

## 2019-12-29 DIAGNOSIS — F0150 Vascular dementia without behavioral disturbance: Secondary | ICD-10-CM

## 2019-12-29 DIAGNOSIS — Z515 Encounter for palliative care: Secondary | ICD-10-CM

## 2019-12-29 NOTE — Progress Notes (Signed)
[facility] Consult Note Telephone: [phone]  Fax: [phone]  PATIENT NAME: [name] DOB: 1933-05-16 MRN: [id number]  PRIMARY CARE PROVIDER:   Adin Hector, MD  REFERRING PROVIDER:  Adin Hector, MD [address] [facility]  [place] [address]  RESPONSIBLE PARTY:Emergency contact. Spouse- [phone] h    TELEHEALTH VISIT STATEMENT Due to the COVID-19 crisis, this visit was done via telephone from my office. It was initiated and consented to by this patient and/or family.  RECOMMENDATIONS/PLAN:   Advance Care Planning/Goals of Care: Telehealth Visit consisted of building trust and follow up on palliative care. Patient is a DNR. Goals of care include to maximize quality of life and symptom management. Today, Blanch Media said she is open to Hospice services when patient becomes eligible for it. Symptom management: Ongoing memory loss and confusion related to Dementia.  FAST 6c, recent urinary incontinence. FLAAC 0. He continues to ambulate within the house with a rolling walker. PT/OT ongoing for strengthening. Nursing aid 2 times a week. Ongoing supportive care encouraged. Follow OX:[id number] care will continue to follow patient for goals of care clarification and symptom management. I spent 30 minutes providing this consultation,  from 3 pm to 3.30pm.  More than 50% of the time in this consultation was spent coordinating communication HISTORY OF PRESENT ILLNESS:Raymond E Stumpis a 86 y.o.year oldmalewith multiple medical problems COPD HTNLeft rotator cuff tear, Dementia. Palliative Care was asked to help address goals of care.   CODE STATUS: DNR  PPS: 40% HOSPICE ELIGIBILITY/DIAGNOSIS: TBD  PAST MEDICAL HISTORY:  Past Medical History:  Diagnosis Date  . Asthma   . CAD (coronary artery disease)    Status post left circumflex stent  . CKD (chronic kidney disease)  stage 3, GFR 30-59 ml/min   . COPD (chronic obstructive pulmonary disease) ([place])   . 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 Diabetes mellitus   . Hypertension   . Myocardial infarction ([place])   . OSA (obstructive sleep apnea)   . Parkinson's disease ([place])   . Shortness of breath   . Stroke [facility])     SOCIAL HX:  Social History   Tobacco Use  . Smoking status: Former Smoker    Packs/day: 2.00    Years: 50.00    Pack years: 100.00    Types: Cigarettes    Quit date: 12/18/2005    Years since quitting: 14.0  . Smokeless tobacco: Never Used  . Tobacco comment: Quit about 12 years ago  Substance Use Topics  . Alcohol use: No    ALLERGIES:  Allergies  Allergen Reactions  . Donepezil Nausea Only  . Atorvastatin Rash and Other (See Comments)    myalgia     PERTINENT MEDICATIONS:  Outpatient Encounter Medications as of 12/29/2019  Medication Sig  . amLODipine (NORVASC) 10 MG tablet take 1 tablet by mouth daily  . amLODipine (NORVASC) 5 MG tablet Take 5 mg by mouth daily.  Marland Kitchen aspirin EC 81 MG tablet Take 81 mg by mouth at bedtime.   . Blood Glucose Monitoring Suppl (ONE TOUCH ULTRA 2) w/Device KIT FPD  . busPIRone (BUSPAR) 15 MG tablet Take 7.5 mg by mouth 2 (two) times daily as needed.  . carbidopa-levodopa (SINEMET IR) 25-100 MG tablet Take 1 tablet by mouth 3 (three) times daily.  . cetirizine (ZYRTEC) 10 MG tablet TAKE 1/2 TABLET BY MOUTH ONCE A  DAY  . cyanocobalamin 500 MCG tablet Take 1,000 mcg by mouth daily.   Marland Kitchen donepezil (ARICEPT) 5 MG tablet Take 5 mg by mouth daily.  . fluticasone (FLONASE) 50 MCG/ACT nasal spray Place 2 sprays into the nose as needed.   . Lancets (ONETOUCH DELICA PLUS [id number]) MISC USE TO CHECK BLOOD SUGAR ONCE A DAY AS DIRECTED. E11.8  . levothyroxine (SYNTHROID, LEVOTHROID) 25 MCG tablet TAKE 1 TAB BY MOUTH ONCE DAILY ON EMPTY STOMACH WITH GLASS OF WATER AT LEAST 30-60 MINS BEFORE BFAST  . losartan (COZAAR) 100 MG tablet Take 100  mg by mouth daily.   Marland Kitchen lovastatin (MEVACOR) 40 MG tablet Take 40 mg by mouth at bedtime.   . meloxicam (MOBIC) 15 MG tablet take 1 tablet by mouth daily as needed  . metoprolol (LOPRESSOR) 50 MG tablet Take by mouth.  . metoprolol succinate (TOPROL-XL) 25 MG 24 hr tablet TAKE 1 TABLET EVERY DAY  . mirtazapine (REMERON) 30 MG tablet Take by mouth.  . Multiple Vitamin (MULTIVITAMIN WITH MINERALS) TABS tablet Take 1 tablet by mouth daily.  Marland Kitchen omeprazole (PRILOSEC) 20 MG capsule Take by mouth.  . ondansetron (ZOFRAN-ODT) 4 MG disintegrating tablet DISSOLVE 1 TABLET ON THE TONGUE EVERY 8 HRS FOR UP TO 3 DAY AS NEEDED FOR NAUSEA OR VOMITING  . ONE TOUCH ULTRA TEST test strip USE TO TEST BLOOD SUGAR QD  . polyethylene glycol (MIRALAX / GLYCOLAX) packet Take 17 g by mouth daily as needed for mild constipation.  . sertraline (ZOLOFT) 25 MG tablet Take by mouth.   No facility-administered encounter medications on file as of 12/29/2019.    Teodoro Spray, NP

## 2020-01-09 ENCOUNTER — Other Ambulatory Visit: Payer: Self-pay

## 2020-01-09 ENCOUNTER — Ambulatory Visit (INDEPENDENT_AMBULATORY_CARE_PROVIDER_SITE_OTHER): Payer: Medicare Other | Admitting: Podiatry

## 2020-01-09 ENCOUNTER — Encounter: Payer: Self-pay | Admitting: Podiatry

## 2020-01-09 DIAGNOSIS — B351 Tinea unguium: Secondary | ICD-10-CM | POA: Diagnosis not present

## 2020-01-09 DIAGNOSIS — E1142 Type 2 diabetes mellitus with diabetic polyneuropathy: Secondary | ICD-10-CM | POA: Diagnosis not present

## 2020-01-09 DIAGNOSIS — M79676 Pain in unspecified toe(s): Secondary | ICD-10-CM

## 2020-01-09 DIAGNOSIS — M2041 Other hammer toe(s) (acquired), right foot: Secondary | ICD-10-CM | POA: Diagnosis not present

## 2020-01-09 DIAGNOSIS — M2042 Other hammer toe(s) (acquired), left foot: Secondary | ICD-10-CM

## 2020-01-09 NOTE — Progress Notes (Signed)
Complaint:  Visit Type: Patient returns to my office for continued preventative foot care services. Complaint: Patient states" my nails have grown long and thick and become painful to walk and wear shoes" Patient has been diagnosed with DM with vascular complications.. The patient presents for preventative foot care services. No changes to ROS  Podiatric Exam: Vascular: dorsalis pedis and posterior tibial pulses are weak   bilateral. Capillary return is immediate. Temperature gradient is WNL. Skin turgor WNL  Sensorium: Diminished  Semmes Weinstein monofilament test. Normal tactile sensation bilaterally. Nail Exam: Pt has thick disfigured discolored nails with subungual debris noted bilateral entire nail hallux through fifth toenails Ulcer Exam: There is no evidence of ulcer or pre-ulcerative changes or infection. Orthopedic Exam: Muscle tone and strength are WNL. No limitations in general ROM. No crepitus or effusions noted. Hammer toes 2-4  B/L  Mallet toe 2-4 B/L   Bony prominences are unremarkable. Skin: No Porokeratosis. No infection or ulcers  Diagnosis:  Onychomycosis, , Pain in right toe, pain in left toes  Treatment & Plan Procedures and Treatment: Consent by patient was obtained for treatment procedures. The patient understood the discussion of treatment and procedures well. All questions were answered thoroughly reviewed. Debridement of mycotic and hypertrophic toenails, 1 through 5 bilateral and clearing of subungual debris. No ulceration, no infection noted.   Return Visit-Office Procedure: Patient instructed to return to the office for a follow up visit 10  weeks for continued evaluation and treatment.    Helane Gunther DPM

## 2020-01-20 ENCOUNTER — Ambulatory Visit: Payer: Medicare Other

## 2020-01-28 ENCOUNTER — Ambulatory Visit: Payer: Medicare Other | Attending: Internal Medicine

## 2020-01-28 DIAGNOSIS — Z23 Encounter for immunization: Secondary | ICD-10-CM | POA: Insufficient documentation

## 2020-01-28 NOTE — Progress Notes (Signed)
   Covid-19 Vaccination Clinic  Name:  [name]    MRN: [id number] DOB: Feb 16, 1933  01/28/2020  [name] was observed post Covid-19 immunization for 15 minutes without incidence. He was provided with Vaccine Information Sheet and instruction to access the V-Safe system.   [name] was instructed to call 911 with any severe reactions post vaccine: Marland Kitchen Difficulty breathing  . Swelling of your face and throat  . A fast heartbeat  . A bad rash all over your body  . Dizziness and weakness    Immunizations Administered    Name Date Dose VIS Date Route   Pfizer COVID-19 Vaccine 01/28/2020  5:22 PM 0.3 mL 12/02/2019 Intramuscular   Manufacturer: [employer], [employer]   Lot: [id number]   NDC: [address]-5

## 2020-02-02 ENCOUNTER — Other Ambulatory Visit: Payer: Medicare Other | Admitting: Hospice

## 2020-02-02 ENCOUNTER — Other Ambulatory Visit: Payer: Self-pay

## 2020-02-02 DIAGNOSIS — F0280 Dementia in other diseases classified elsewhere without behavioral disturbance: Secondary | ICD-10-CM

## 2020-02-02 DIAGNOSIS — Z515 Encounter for palliative care: Secondary | ICD-10-CM

## 2020-02-02 DIAGNOSIS — F0150 Vascular dementia without behavioral disturbance: Secondary | ICD-10-CM

## 2020-02-02 NOTE — Progress Notes (Signed)
[facility] Pall[facility] Care Consult Note Telephone: [phone]  Fax: [phone]  PATIENT NAME: [name] DOB: 11/05/1933 MRN: [id number]  PRIMARY CARE PROVIDER:   Adin Hector, MD  REFERRING PROVIDER:  Adin Hector, MD [facility]  [place] [address]  RESPONSIBLE PARTY:Emergency contact. Spouse- [phone] h    TELEHEALTH VISIT STATEMENT Due to the COVID-19 crisis, this visit was done via telephone from my office. It was initiated and consented to by this patient and/or family.  RECOMMENDATIONS/PLAN:   Advance Care Planning/Goals of Care: Telehealth Visit consisted of building trust and follow up on palliative care. Patient is a DNR. Goals of care include to maximize quality of life and symptom management. Caregiver stain noted as Blanch Media shared her challenges with caring for patient and her own failing health. Therapeutic listening and ample emotional support provided. Nursing aid, PT/OT services are ongoing. She agreed to breath and find time for self care. Scheduled In-person visit in a week, per her request; she is open to Hospice services when patient becomes eligible for it. Symptom management:Spouse reports worsening  memory loss and confusion related to Dementia. FAST 6c, requiring more cues. He is no longer able to ambulate around the house, mostly in his lift chair, max assist to stand. PT/OT ongoing for strengthening. Nursing aid 2 times a week. Ongoing supportive care encouraged. Follow DQ:[id number] care will continue to follow patient for goals of care clarification and symptom management. I spent56mnutes providing this consultation.More than 50% of the time in this consultation was spent coordinating communication HISTORY OF PRESENT ILLNESS:Raymond E Stumpis a 86y.o.year oldmalewith multiple medical problems COPD HTNLeft rotator cuff tear, Dementia.  Palliative Care was asked to help address goals of care.   CODE STATUS: DNR  PPS: 40% HOSPICE ELIGIBILITY/DIAGNOSIS: TBD  PAST MEDICAL HISTORY:  Past Medical History:  Diagnosis Date  . Asthma   . CAD (coronary artery disease)    Status post left circumflex stent  . CKD (chronic kidney disease) stage 3, GFR 30-59 ml/min   . COPD (chronic obstructive pulmonary disease) ([place]   . 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 Diabetes mellitus   . Hypertension   . Myocardial infarction ([place]   . OSA (obstructive sleep apnea)   . Parkinson's disease ([place]   . Shortness of breath   . Stroke ([facility]     SOCIAL HX:  Social History   Tobacco Use  . Smoking status: Former Smoker    Packs/day: 2.00    Years: 50.00    Pack years: 100.00    Types: Cigarettes    Quit date: 12/18/2005    Years since quitting: 14.1  . Smokeless tobacco: Never Used  . Tobacco comment: Quit about 12 years ago  Substance Use Topics  . Alcohol use: No    ALLERGIES:  Allergies  Allergen Reactions  . Donepezil Nausea Only  . Atorvastatin Rash and Other (See Comments)    myalgia     PERTINENT MEDICATIONS:  Outpatient Encounter Medications as of 02/02/2020  Medication Sig  . amLODipine (NORVASC) 10 MG tablet take 1 tablet by mouth daily  . amLODipine (NORVASC) 5 MG tablet Take 5 mg by mouth daily.  .Marland Kitchenaspirin EC 81 MG tablet Take 81 mg by mouth at bedtime.   . Blood Glucose Monitoring Suppl (ONE TOUCH ULTRA 2) w/Device KIT FPD  . busPIRone (BUSPAR) 15 MG  tablet Take 7.5 mg by mouth 2 (two) times daily as needed.  . carbidopa-levodopa (SINEMET IR) 25-100 MG tablet Take 1 tablet by mouth 3 (three) times daily.  . cetirizine (ZYRTEC) 10 MG tablet TAKE 1/2 TABLET BY MOUTH ONCE A DAY  . cyanocobalamin 500 MCG tablet Take 1,000 mcg by mouth daily.   Marland Kitchen donepezil (ARICEPT) 5 MG tablet Take 5 mg by mouth daily.  . fluticasone (FLONASE) 50 MCG/ACT nasal spray Place 2 sprays into the nose as  needed.   . Lancets (ONETOUCH DELICA PLUS [id number]) MISC USE TO CHECK BLOOD SUGAR ONCE A DAY AS DIRECTED. E11.8  . levothyroxine (SYNTHROID, LEVOTHROID) 25 MCG tablet TAKE 1 TAB BY MOUTH ONCE DAILY ON EMPTY STOMACH WITH GLASS OF WATER AT LEAST 30-60 MINS BEFORE BFAST  . losartan (COZAAR) 100 MG tablet Take 100 mg by mouth daily.   Marland Kitchen lovastatin (MEVACOR) 40 MG tablet Take 40 mg by mouth at bedtime.   . meloxicam (MOBIC) 15 MG tablet take 1 tablet by mouth daily as needed  . metoprolol (LOPRESSOR) 50 MG tablet Take by mouth.  . metoprolol succinate (TOPROL-XL) 25 MG 24 hr tablet TAKE 1 TABLET EVERY DAY  . mirtazapine (REMERON) 30 MG tablet Take by mouth.  . Multiple Vitamin (MULTIVITAMIN WITH MINERALS) TABS tablet Take 1 tablet by mouth daily.  Marland Kitchen omeprazole (PRILOSEC) 20 MG capsule Take by mouth.  . ondansetron (ZOFRAN-ODT) 4 MG disintegrating tablet DISSOLVE 1 TABLET ON THE TONGUE EVERY 8 HRS FOR UP TO 3 DAY AS NEEDED FOR NAUSEA OR VOMITING  . ONE TOUCH ULTRA TEST test strip USE TO TEST BLOOD SUGAR QD  . polyethylene glycol (MIRALAX / GLYCOLAX) packet Take 17 g by mouth daily as needed for mild constipation.  . QUEtiapine (SEROQUEL) 25 MG tablet Take by mouth.  . sertraline (ZOLOFT) 25 MG tablet Take by mouth.   No facility-administered encounter medications on file as of 02/02/2020.    Teodoro Spray, NP

## 2020-02-09 ENCOUNTER — Other Ambulatory Visit: Payer: Self-pay

## 2020-02-09 ENCOUNTER — Other Ambulatory Visit: Payer: Medicare Other | Admitting: Hospice

## 2020-02-09 DIAGNOSIS — G309 Alzheimer's disease, unspecified: Secondary | ICD-10-CM

## 2020-02-09 DIAGNOSIS — Z515 Encounter for palliative care: Secondary | ICD-10-CM

## 2020-02-09 DIAGNOSIS — F0150 Vascular dementia without behavioral disturbance: Secondary | ICD-10-CM

## 2020-02-09 NOTE — Progress Notes (Signed)
[facility] Palliative [facility] Consult Note Telephone: [phone]  Fax: [phone]  PATIENT NAME: [name] DOB: 07/14/1933 MRN: [id number]  PRIMARY CARE PROVIDER:   Adin Hector, MD  REFERRING PROVIDER:  Adin Hector, MD [facility],  [place] [address]   RESPONSIBLE PARTY:Emergency contact. Spouse- [phone]   TELEHEALTH VISIT STATEMENT Due to the COVID-19 crisis, this visit was done via telephone from my office. It was initiated and consented to by this patient and/or family.  RECOMMENDATIONS/PLAN:  Advance Care Planning/Goals of Care: Telehealth Visit consisted of building trust andfollow up on palliative care. Patient is a DNR.Goals of care include to maximize quality of life and symptom management. Caregiver stain improved; she said she feels a lot better than before with Home health services ongoing. She shared she has not had experience with anyone with Dementia and it could be emotionally devastating to see a loved one so diminished. Therapeutic listening and emotional support provided; self care also encouraged. She is open to Hospice services when patient qualifies.  Symptom management:Ongoing memory loss and confusion related to Dementia. FAST 6c, requiring more cues. With PT/OT patient now able to stand and take some steps. Patient continues with fatigue especially evening time. Blanch Media reports appetite is good in the past week. Nursing aid 2 times a week.Ongoing supportive care encouraged. Follow [id number] care will continue to follow patient for goals of care clarification and symptom management. I spent64mnutes providing this consultation.More than 50% of the time in this consultation was spent coordinating communication HISTORY OF PRESENT ILLNESS:[name] a 86y.o.year oldmalewith multiple medical problems COPD HTNLeft rotator cuff  tear, Dementia. Palliative Care was asked to help address goals of care.   CODE STATUS: DNR  PPS: 40% HOSPICE ELIGIBILITY/DIAGNOSIS: TBD  PAST MEDICAL HISTORY:  Past Medical History:  Diagnosis Date  . Asthma   . CAD (coronary artery disease)    Status post left circumflex stent  . CKD (chronic kidney disease) stage 3, GFR 30-59 ml/min   . COPD (chronic obstructive pulmonary disease) ([place]   . Dementia in Alzheimer's disease with early onset ([place]   . Diabetes mellitus   . Hypertension   . Myocardial infarction ([place]   . OSA (obstructive sleep apnea)   . Parkinson's disease ([place]   . Shortness of breath   . Stroke ([facility]     SOCIAL HX:  Social History   Tobacco Use  . Smoking status: Former Smoker    Packs/day: 2.00    Years: 50.00    Pack years: 100.00    Types: Cigarettes    Quit date: 12/18/2005    Years since quitting: 14.1  . Smokeless tobacco: Never Used  . Tobacco comment: Quit about 12 years ago  Substance Use Topics  . Alcohol use: No    ALLERGIES:  Allergies  Allergen Reactions  . Donepezil Nausea Only  . Atorvastatin Rash and Other (See Comments)    myalgia     PERTINENT MEDICATIONS:  Outpatient Encounter Medications as of 02/09/2020  Medication Sig  . amLODipine (NORVASC) 10 MG tablet take 1 tablet by mouth daily  . amLODipine (NORVASC) 5 MG tablet Take 5 mg by mouth daily.  .Marland Kitchenaspirin EC 81 MG tablet Take 81 mg by mouth at bedtime.   . Blood Glucose Monitoring Suppl (ONE TOUCH ULTRA 2) w/Device KIT FPD  . busPIRone (BUSPAR) 15 MG tablet Take 7.5  mg by mouth 2 (two) times daily as needed.  . carbidopa-levodopa (SINEMET IR) 25-100 MG tablet Take 1 tablet by mouth 3 (three) times daily.  . cetirizine (ZYRTEC) 10 MG tablet TAKE 1/2 TABLET BY MOUTH ONCE A DAY  . cyanocobalamin 500 MCG tablet Take 1,000 mcg by mouth daily.   Marland Kitchen donepezil (ARICEPT) 5 MG tablet Take 5 mg by mouth daily.  . fluticasone (FLONASE) 50 MCG/ACT nasal spray Place 2 sprays  into the nose as needed.   . Lancets (ONETOUCH DELICA PLUS [id number]) MISC USE TO CHECK BLOOD SUGAR ONCE A DAY AS DIRECTED. E11.8  . levothyroxine (SYNTHROID, LEVOTHROID) 25 MCG tablet TAKE 1 TAB BY MOUTH ONCE DAILY ON EMPTY STOMACH WITH GLASS OF WATER AT LEAST 30-60 MINS BEFORE BFAST  . losartan (COZAAR) 100 MG tablet Take 100 mg by mouth daily.   Marland Kitchen lovastatin (MEVACOR) 40 MG tablet Take 40 mg by mouth at bedtime.   . meloxicam (MOBIC) 15 MG tablet take 1 tablet by mouth daily as needed  . metoprolol (LOPRESSOR) 50 MG tablet Take by mouth.  . metoprolol succinate (TOPROL-XL) 25 MG 24 hr tablet TAKE 1 TABLET EVERY DAY  . mirtazapine (REMERON) 30 MG tablet Take by mouth.  . Multiple Vitamin (MULTIVITAMIN WITH MINERALS) TABS tablet Take 1 tablet by mouth daily.  Marland Kitchen omeprazole (PRILOSEC) 20 MG capsule Take by mouth.  . ondansetron (ZOFRAN-ODT) 4 MG disintegrating tablet DISSOLVE 1 TABLET ON THE TONGUE EVERY 8 HRS FOR UP TO 3 DAY AS NEEDED FOR NAUSEA OR VOMITING  . ONE TOUCH ULTRA TEST test strip USE TO TEST BLOOD SUGAR QD  . polyethylene glycol (MIRALAX / GLYCOLAX) packet Take 17 g by mouth daily as needed for mild constipation.  . QUEtiapine (SEROQUEL) 25 MG tablet Take by mouth.  . sertraline (ZOLOFT) 25 MG tablet Take by mouth.   No facility-administered encounter medications on file as of 02/09/2020.    Teodoro Spray, NP

## 2020-02-22 ENCOUNTER — Ambulatory Visit: Payer: Medicare Other | Attending: Internal Medicine

## 2020-02-22 DIAGNOSIS — Z23 Encounter for immunization: Secondary | ICD-10-CM | POA: Insufficient documentation

## 2020-02-22 NOTE — Progress Notes (Signed)
   Covid-19 Vaccination Clinic  Name:  [name]    MRN: [id number] DOB: 1933-05-29  02/22/2020  Mr. Benninger was observed post Covid-19 immunization for 15 minutes without incident. He was provided with Vaccine Information Sheet and instruction to access the V-Safe system.   Mr. Carreira was instructed to call 911 with any severe reactions post vaccine: Marland Kitchen Difficulty breathing  . Swelling of face and throat  . A fast heartbeat  . A bad rash all over body  . Dizziness and weakness   Immunizations Administered    Name Date Dose VIS Date Route   Pfizer COVID-19 Vaccine 02/22/2020 11:27 AM 0.3 mL 12/02/2019 Intramuscular   Manufacturer: [employer], [employer]   Lot: [id number]   NDC: [address]-6

## 2020-03-01 ENCOUNTER — Ambulatory Visit (INDEPENDENT_AMBULATORY_CARE_PROVIDER_SITE_OTHER): Payer: Medicare Other | Admitting: Vascular Surgery

## 2020-03-01 ENCOUNTER — Encounter (INDEPENDENT_AMBULATORY_CARE_PROVIDER_SITE_OTHER): Payer: Medicare Other

## 2020-03-15 ENCOUNTER — Other Ambulatory Visit: Payer: Medicare Other | Admitting: Hospice

## 2020-03-15 ENCOUNTER — Other Ambulatory Visit: Payer: Self-pay

## 2020-03-15 DIAGNOSIS — G309 Alzheimer's disease, unspecified: Secondary | ICD-10-CM

## 2020-03-15 DIAGNOSIS — Z515 Encounter for palliative care: Secondary | ICD-10-CM

## 2020-03-15 DIAGNOSIS — F0150 Vascular dementia without behavioral disturbance: Secondary | ICD-10-CM

## 2020-03-15 NOTE — Progress Notes (Signed)
[facility] Palliative Care Consult Note Telephone: [phone]  Fax: [phone]  PATIENT NAME: [name] DOB: 1933/01/07 MRN: [id number]  PRIMARY CARE PROVIDER:   Adin Hector, MD  REFERRING PROVIDER:  Adin Hector, MD [facility] [place],  [place] [address]  RESPONSIBLE PARTY:Emergency contact. Spouse- [phone] h    RECOMMENDATIONS/PLAN:  Advance Care Planning/Goals of Care:  Visit at the request of patient/spouse to further assess decline and eligibility for Hospice services. Patient is a DNR.Goals of care include to maximize quality of life and symptom management.Patient with worsening muscle wasting and abnormal weight loss. Patient is 5 feet and 9 inches with weight loss of 20Ibs in the past six months representing 12.5% weight loss. He is currently 140Ibs BMI 20.67. Spouse reports increasing weakness and sleeping more up to 16 hours a day, up from 12 hours 3 months ago. Spouse also shared the loss of her sister in law this month d/t COVID-19 but not able to share the news with patient because of his ongoing decline in mental and functional status. Patient with Dementia FAST 6c, FLACC 0.  Therapeutic listening and emotional support provided; self care also encouraged. MOST form filled out and signed today; selections including comfort care, no tube feeding, no IV fluids, antibiotics as determined. MOST form uploaded in [place] today. [facility] CMO endorsed patient eligible for Hospice services. Message left with Cyndy in PCP's office for his endorsement. Hospice referral initiated.  Follow [id number] care will continue to follow patient for goals of care clarification and symptom management. I spentone hour and 16 minutes providing this consultation.More than 50% of the time in this consultation was spent coordinating communication HISTORY OF PRESENT ILLNESS:[name] a  86 y.o.year oldmalewith multiple medical problems Abnormal weight loss COPD HTNLeft rotator cuff tear, Dementia. Palliative Care was asked to help address goals of care.  CODE STATUS: DNR  PPS: weak 40% HOSPICE ELIGIBILITY/DIAGNOSIS: TBD  PAST MEDICAL HISTORY:  Past Medical History:  Diagnosis Date  . Asthma   . CAD (coronary artery disease)    Status post left circumflex stent  . CKD (chronic kidney disease) stage 3, GFR 30-59 ml/min   . COPD (chronic obstructive pulmonary disease) ([place])   . Dementia in Alzheimer's disease with early onset ([place])   . Diabetes mellitus   . Hypertension   . Myocardial infarction ([place])   . OSA (obstructive sleep apnea)   . Parkinson's disease ([place])   . Shortness of breath   . Stroke [facility])     SOCIAL HX:  Social History   Tobacco Use  . Smoking status: Former Smoker    Packs/day: 2.00    Years: 50.00    Pack years: 100.00    Types: Cigarettes    Quit date: 12/18/2005    Years since quitting: 14.2  . Smokeless tobacco: Never Used  . Tobacco comment: Quit about 12 years ago  Substance Use Topics  . Alcohol use: No    ALLERGIES:  Allergies  Allergen Reactions  . Donepezil Nausea Only  . Atorvastatin Rash and Other (See Comments)    myalgia     PERTINENT MEDICATIONS:  Outpatient Encounter Medications as of 03/15/2020  Medication Sig  . amLODipine (NORVASC) 10 MG tablet take 1 tablet by mouth daily  . amLODipine (NORVASC) 5 MG tablet Take 5 mg by mouth daily.  Marland Kitchen aspirin EC 81 MG tablet Take 81 mg by  mouth at bedtime.   . Blood Glucose Monitoring Suppl (ONE TOUCH ULTRA 2) w/Device KIT FPD  . busPIRone (BUSPAR) 15 MG tablet Take 7.5 mg by mouth 2 (two) times daily as needed.  . carbidopa-levodopa (SINEMET IR) 25-100 MG tablet Take 1 tablet by mouth 3 (three) times daily.  . cetirizine (ZYRTEC) 10 MG tablet TAKE 1/2 TABLET BY MOUTH ONCE A DAY  . cyanocobalamin 500 MCG tablet Take 1,000 mcg by mouth daily.   Marland Kitchen donepezil (ARICEPT) 5  MG tablet Take 5 mg by mouth daily.  . fluticasone (FLONASE) 50 MCG/ACT nasal spray Place 2 sprays into the nose as needed.   . Lancets (ONETOUCH DELICA PLUS [id number]) MISC USE TO CHECK BLOOD SUGAR ONCE A DAY AS DIRECTED. E11.8  . levothyroxine (SYNTHROID, LEVOTHROID) 25 MCG tablet TAKE 1 TAB BY MOUTH ONCE DAILY ON EMPTY STOMACH WITH GLASS OF WATER AT LEAST 30-60 MINS BEFORE BFAST  . losartan (COZAAR) 100 MG tablet Take 100 mg by mouth daily.   Marland Kitchen lovastatin (MEVACOR) 40 MG tablet Take 40 mg by mouth at bedtime.   . meloxicam (MOBIC) 15 MG tablet take 1 tablet by mouth daily as needed  . metoprolol (LOPRESSOR) 50 MG tablet Take by mouth.  . metoprolol succinate (TOPROL-XL) 25 MG 24 hr tablet TAKE 1 TABLET EVERY DAY  . mirtazapine (REMERON) 30 MG tablet Take by mouth.  . Multiple Vitamin (MULTIVITAMIN WITH MINERALS) TABS tablet Take 1 tablet by mouth daily.  Marland Kitchen omeprazole (PRILOSEC) 20 MG capsule Take by mouth.  . ondansetron (ZOFRAN-ODT) 4 MG disintegrating tablet DISSOLVE 1 TABLET ON THE TONGUE EVERY 8 HRS FOR UP TO 3 DAY AS NEEDED FOR NAUSEA OR VOMITING  . ONE TOUCH ULTRA TEST test strip USE TO TEST BLOOD SUGAR QD  . polyethylene glycol (MIRALAX / GLYCOLAX) packet Take 17 g by mouth daily as needed for mild constipation.  . QUEtiapine (SEROQUEL) 25 MG tablet Take by mouth.  . sertraline (ZOLOFT) 25 MG tablet Take by mouth.   No facility-administered encounter medications on file as of 03/15/2020.    PHYSICAL EXAM/ROS:   General: cooperative, sleepy, in no acute distress Cardiovascular: regular rate and rhythm; denied chest pain Pulmonary: clear ant/post fields. Normal respiratory effort; no cough, no SOB Abdomen: soft, nontender, + bowel sounds GU: no suprapubic tenderness Extremities: bilateral non pitting lower extremity edema , no joint deformities Skin: no rashes to exposed skin Neurological: Weakness but otherwise nonfocal; confused, forgetful.   Teodoro Spray, NP

## 2020-03-19 ENCOUNTER — Encounter: Payer: Self-pay | Admitting: *Deleted

## 2020-03-19 ENCOUNTER — Ambulatory Visit (INDEPENDENT_AMBULATORY_CARE_PROVIDER_SITE_OTHER): Payer: Medicare Other | Admitting: Podiatry

## 2020-03-19 ENCOUNTER — Encounter: Payer: Self-pay | Admitting: Podiatry

## 2020-03-19 ENCOUNTER — Other Ambulatory Visit: Payer: Self-pay

## 2020-03-19 VITALS — Temp 97.7°F

## 2020-03-19 DIAGNOSIS — E1159 Type 2 diabetes mellitus with other circulatory complications: Secondary | ICD-10-CM

## 2020-03-19 DIAGNOSIS — M79676 Pain in unspecified toe(s): Secondary | ICD-10-CM

## 2020-03-19 DIAGNOSIS — B351 Tinea unguium: Secondary | ICD-10-CM | POA: Diagnosis not present

## 2020-03-19 DIAGNOSIS — E1142 Type 2 diabetes mellitus with diabetic polyneuropathy: Secondary | ICD-10-CM | POA: Diagnosis not present

## 2020-03-19 NOTE — Progress Notes (Signed)
Mr. Rama Cerro arrived for his appointment.  His wife came in the office and requested a wheel chair.  I rolled the wheel chair to the front desk.  She stated he was in the car.  I offered her the wheel chair.  She stated someone from the office normally assists him in getting out of the car and rolling him in.  I informed her that we are not supposed to lift anyone out of the car.  She stated that is what they have done the past five times that he has come. I informed her we are not supposed to because it can be a hazard for the patient as well as ourselves.  I informed her that I would roll the chair out so he can get in it and roll him in but I cannot assist him in getting in to the chair.  She asked me who I was.  I gave her my name and title.  She asked me how long I had been here.  I told her I had been with the [facility] for 23 years.  She stated that was not what she asked.  She stated I asked you how long have you been here.  I informed her that I have been at this location since January.  She assisted her husband out of the truck and got him into the chair.  She stated she would discuss this with the doctor.  I informed her that would be fine an reiterated that it is a [employer].   As she was checking out her husband, Dr. Stacie Acres and Geraldine Contras escorted the patient to his truck and assisted him in getting into the truck. Mrs. Belue told me that she had never seen anyone boast about their title like I had.  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 I tried to respond to let her know that she had asked me what my title was,  She told me to be quiet because she didn't want to hear what I had to say.  I tried to speak again and she told me to be quiet because she didn't want to hear from me and that had said what she needed to say. I informed her that she was being disrespectful and asked her to refrain from doing so.  She stated I had disrespected her  first in the parking lot.  I informed her I had only made aware her of the rules in the parking lot.  I scheduled his next appointment and gave her the after visit summary.  She told me that they had been coming here a long time and it had been pleasant and that she hoped I didn't ruin the office's reputation and run the patients away.  She exited the office.  I plan on discussing this incident with Fernande Bras, office manager, and Baldomero Lamy, Clinical Supervisor, so everyone can be aware of the [facility] policy so this type of incident can be avoided in the future.  This can be a liability for the patient as well as ourselves.  I discussed my concern briefly with Geraldine Contras and Dr. Stacie Acres.

## 2020-03-19 NOTE — Progress Notes (Signed)
This patient returns to my office for at risk foot care.  This patient requires this care by a professional since this patient will be at risk due to having chronic vascular disease, pvd, and type 2  DM.  Patient is in a wheelchair and accompanied by his wife. This patient is unable to cut nails himself since the patient cannot reach his nails.These nails are painful walking and wearing shoes.  This patient presents for at risk foot care today.  General Appearance  Alert, conversant and in no acute stress.  Vascular  Dorsalis pedis and posterior tibial  pulses are weakly  palpable  bilaterally.  Capillary return is within normal limits  bilaterally. Temperature is within normal limits  bilaterally.  Neurologic  Senn-Weinstein monofilament wire test diminished  bilaterally. Muscle power within normal limits bilaterally.  Nails Thick disfigured discolored nails with subungual debris  from hallux to fifth toes bilaterally. No evidence of bacterial infection or drainage bilaterally.  Orthopedic  No limitations of motion  feet .  No crepitus or effusions noted.  No bony pathology or digital deformities noted. Hammer toes/mallet toe 2-4  B/L.  Skin  normotropic skin with no porokeratosis noted bilaterally.  No signs of infections or ulcers noted.     Onychomycosis  Pain in right toes  Pain in left toes  Consent was obtained for treatment procedures.   Mechanical debridement of nails 1-5  bilaterally performed with a nail nipper.  Filed with dremel without incident.    Return office visit    3 months                  Told patient to return for periodic foot care and evaluation due to potential at risk complications.   Helane Gunther DPM

## 2020-05-07 ENCOUNTER — Ambulatory Visit (INDEPENDENT_AMBULATORY_CARE_PROVIDER_SITE_OTHER): Payer: Medicare Other | Admitting: Vascular Surgery

## 2020-05-07 ENCOUNTER — Encounter (INDEPENDENT_AMBULATORY_CARE_PROVIDER_SITE_OTHER): Payer: Medicare Other

## 2020-06-21 ENCOUNTER — Ambulatory Visit: Payer: Medicare Other | Admitting: Podiatry

## 2020-06-21 DEATH — deceased

## 2022-10-20 ENCOUNTER — Encounter (INDEPENDENT_AMBULATORY_CARE_PROVIDER_SITE_OTHER): Payer: Self-pay

## 2023-05-12 ENCOUNTER — Other Ambulatory Visit (HOSPITAL_COMMUNITY): Payer: Self-pay
